# Patient Record
Sex: Male | Born: 1995 | Race: White | Hispanic: No | Marital: Single | Smoking: Former smoker
Health system: Southern US, Community
[De-identification: ages and names within clinical notes are randomized; demographics above are authoritative.]

## PROBLEM LIST (undated history)

## (undated) DIAGNOSIS — I341 Nonrheumatic mitral (valve) prolapse: Secondary | ICD-10-CM

## (undated) DIAGNOSIS — J45990 Exercise induced bronchospasm: Secondary | ICD-10-CM

## (undated) DIAGNOSIS — K649 Unspecified hemorrhoids: Secondary | ICD-10-CM

## (undated) DIAGNOSIS — I10 Essential (primary) hypertension: Secondary | ICD-10-CM

## (undated) DIAGNOSIS — K219 Gastro-esophageal reflux disease without esophagitis: Secondary | ICD-10-CM

## (undated) DIAGNOSIS — E559 Vitamin D deficiency, unspecified: Secondary | ICD-10-CM

## (undated) DIAGNOSIS — G4730 Sleep apnea, unspecified: Secondary | ICD-10-CM

## (undated) DIAGNOSIS — Z8489 Family history of other specified conditions: Secondary | ICD-10-CM

## (undated) DIAGNOSIS — F419 Anxiety disorder, unspecified: Secondary | ICD-10-CM

## (undated) DIAGNOSIS — Z8719 Personal history of other diseases of the digestive system: Secondary | ICD-10-CM

## (undated) DIAGNOSIS — R7303 Prediabetes: Secondary | ICD-10-CM

## (undated) DIAGNOSIS — R011 Cardiac murmur, unspecified: Secondary | ICD-10-CM

## (undated) DIAGNOSIS — K5790 Diverticulosis of intestine, part unspecified, without perforation or abscess without bleeding: Secondary | ICD-10-CM

## (undated) HISTORY — PX: TONSILLECTOMY AND ADENOIDECTOMY: SUR1326

## (undated) HISTORY — DX: Essential (primary) hypertension: I10

## (undated) HISTORY — PX: LUMBAR MICRODISCECTOMY: SHX99

## (undated) HISTORY — DX: Vitamin D deficiency, unspecified: E55.9

## (undated) HISTORY — DX: Anxiety disorder, unspecified: F41.9

## (undated) HISTORY — DX: Exercise induced bronchospasm: J45.990

## (undated) HISTORY — DX: Sleep apnea, unspecified: G47.30

## (undated) HISTORY — PX: ADENOIDECTOMY: SUR15

## (undated) HISTORY — PX: TYMPANOSTOMY TUBE PLACEMENT: SHX32

## (undated) HISTORY — PX: APPENDECTOMY: SHX54

## (undated) HISTORY — PX: ESOPHAGOGASTRODUODENOSCOPY: SHX1529

## (undated) HISTORY — PX: COLONOSCOPY W/ BIOPSIES: SHX1374

## (undated) HISTORY — PX: MEATOTOMY: SUR859

---
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
## 2015-03-01 ENCOUNTER — Ambulatory Visit (HOSPITAL_COMMUNITY)
Admission: RE | Admit: 2015-03-01 | Discharge: 2015-03-01 | Disposition: A | Discharge status: Home / Self Care | Payer: Managed Care, Other (non HMO) | Source: Ambulatory Visit | Attending: Pulmonary Disease | Admitting: Pulmonary Disease

## 2015-03-01 ENCOUNTER — Other Ambulatory Visit (HOSPITAL_COMMUNITY): Payer: Self-pay | Admitting: Pulmonary Disease

## 2015-03-01 ENCOUNTER — Other Ambulatory Visit (HOSPITAL_COMMUNITY)
Admission: RE | Admit: 2015-03-01 | Discharge: 2015-03-01 | Disposition: A | Discharge status: Home / Self Care | Payer: Managed Care, Other (non HMO) | Source: Ambulatory Visit | Attending: Pulmonary Disease | Admitting: Pulmonary Disease

## 2015-03-01 DIAGNOSIS — R634 Abnormal weight loss: Secondary | ICD-10-CM | POA: Diagnosis not present

## 2015-03-01 DIAGNOSIS — E039 Hypothyroidism, unspecified: Secondary | ICD-10-CM | POA: Insufficient documentation

## 2015-03-01 DIAGNOSIS — R599 Enlarged lymph nodes, unspecified: Secondary | ICD-10-CM

## 2015-03-01 DIAGNOSIS — R591 Generalized enlarged lymph nodes: Secondary | ICD-10-CM | POA: Insufficient documentation

## 2015-03-01 LAB — TSH: TSH: 1.453 u[IU]/mL (ref 0.350–4.500)

## 2015-03-01 MED ORDER — IOHEXOL 300 MG/ML  SOLN
75.0000 mL | Freq: Once | INTRAMUSCULAR | Status: AC | PRN
  Administered 2015-03-01: 75 mL via INTRAVENOUS

## 2015-05-10 ENCOUNTER — Ambulatory Visit (INDEPENDENT_AMBULATORY_CARE_PROVIDER_SITE_OTHER): Payer: Managed Care, Other (non HMO) | Admitting: Otolaryngology

## 2015-05-10 DIAGNOSIS — R590 Localized enlarged lymph nodes: Secondary | ICD-10-CM

## 2015-05-10 DIAGNOSIS — J310 Chronic rhinitis: Secondary | ICD-10-CM | POA: Diagnosis not present

## 2015-08-30 ENCOUNTER — Ambulatory Visit (INDEPENDENT_AMBULATORY_CARE_PROVIDER_SITE_OTHER): Payer: Managed Care, Other (non HMO) | Admitting: Otolaryngology

## 2015-08-30 DIAGNOSIS — R590 Localized enlarged lymph nodes: Secondary | ICD-10-CM | POA: Diagnosis not present

## 2016-01-10 ENCOUNTER — Ambulatory Visit (INDEPENDENT_AMBULATORY_CARE_PROVIDER_SITE_OTHER): Payer: Managed Care, Other (non HMO) | Admitting: Otolaryngology

## 2016-09-23 ENCOUNTER — Ambulatory Visit (INDEPENDENT_AMBULATORY_CARE_PROVIDER_SITE_OTHER): Payer: Self-pay

## 2016-09-23 ENCOUNTER — Ambulatory Visit (INDEPENDENT_AMBULATORY_CARE_PROVIDER_SITE_OTHER): Payer: Self-pay | Admitting: Orthopaedic Surgery

## 2016-09-23 ENCOUNTER — Encounter: Payer: Self-pay | Admitting: Orthopaedic Surgery

## 2016-09-23 VITALS — BP 134/85 | HR 65 | Temp 97.5°F | Ht 73.0 in | Wt 247.0 lb

## 2016-09-23 DIAGNOSIS — M25561 Pain in right knee: Secondary | ICD-10-CM

## 2016-09-23 NOTE — Patient Instructions (Signed)
Out of work 

## 2016-09-23 NOTE — Progress Notes (Signed)
Subjective:    Patient ID: Corey Henry, male    DOB: [birth year], 21 y.o.   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  Last night around 8 pm while he was on the job standing next to pump panel of the fire truck, he turned and felt a severe pop in the right knee and had severe pain.  He could not stand.  He has had pain all night and today.  He has had swelling.  He has no redness, no numbness.  He has used ice and Advil which helped.  He has no prior problems with his knee and no trauma.  He cannot fully extend the knee and limps badly.     Review of Systems  HENT: Negative for congestion.   Respiratory: Negative for cough and shortness of breath.   Cardiovascular: Negative for chest pain and leg swelling.  Endocrine: Negative for cold intolerance.  Musculoskeletal: Positive for arthralgias and gait problem.  Allergic/Immunologic: Negative for environmental allergies.   Past Medical History:  Diagnosis Date  . Exercise-induced asthma   . Hypertension     Past Surgical History:  Procedure Laterality Date  . APPENDECTOMY    . MEATOTOMY    . TONSILLECTOMY AND ADENOIDECTOMY    . TYMPANOSTOMY TUBE PLACEMENT      Current Outpatient Prescriptions on File Prior to Visit  Medication Sig Dispense Refill  . etodolac (LODINE) 300 MG capsule Take 1 capsule (300 mg total) by mouth every 8 (eight) hours. (Patient not taking: Reported on 09/23/2016) 60 capsule 3  . traMADol-acetaminophen (ULTRACET) 37.5-325 MG per tablet Take 1-2 tablets by mouth every 4 (four) hours as needed for pain. (Patient not taking: Reported on 09/23/2016) 90 tablet 5   No current facility-administered medications on file prior to visit.     Social History   Social History  . Marital status: Single    Spouse name: N/A  . Number of children: N/A  . Years of education: N/A   Occupational History  . Not on file.   Social History Main Topics  . Smoking status: Never Smoker   . Smokeless tobacco: Not on file  . Alcohol use No  . Drug use: No  . Sexual activity: Not on file   Other Topics Concern  . Not on file   Social History Narrative  . No narrative on file    Family History  Problem Relation Age of Onset  . Cancer Unknown   . Diabetes Unknown   . Diabetes Father     BP 134/85   Pulse 65   Temp (!) 97.5 F (36.4 C)   Ht 6\' 1"  (1.854 m)   Wt 247 lb (112 kg)   BMI 32.59 kg/m      Objective:   Physical Exam  Constitutional: He is oriented to person, place, and time. He appears well-developed and well-nourished.  HENT:  Head: Normocephalic and atraumatic.  Eyes: Conjunctivae and EOM are normal. Pupils are equal, round, and reactive to light.  Neck: Normal range of motion. Neck supple.  Cardiovascular: Normal rate, regular rhythm and intact distal pulses.   Pulmonary/Chest: Effort normal.  Abdominal: Soft.  Musculoskeletal: He exhibits tenderness (Right knee tender medially, 1+ effusion, ROM 5 to 90 with pain, unable to fully extend, positive medial McMurray, NV intact, limp right, left knee negative.).  Neurological: He is alert and oriented to person, place, and time. He has normal reflexes. He displays normal  reflexes. No cranial nerve deficit. He exhibits normal muscle tone. Coordination normal.  Skin: Skin is warm and dry.  Psychiatric: He has a normal mood and affect. His behavior is normal. Judgment and thought content normal.   X-rays were done and reported separately.       Assessment & Plan:   Encounter Diagnosis  Name Primary?  . Acute pain of right knee Yes   I am concerned about a medial meniscus tear of the right knee.  He cannot fully extend the knee, has pain, positive medial McMurray.  I would like to get a MRI.  Stay out of work.  Use crutch or crutches.  Elevate, ice, Advil.  Return after MRI.  Call if any problem.  Precautions discussed.  Electronically Signed Darreld Mclean, MD 7/10/20182:49  PM

## 2016-09-30 ENCOUNTER — Telehealth: Payer: Self-pay | Admitting: Orthopaedic Surgery

## 2016-09-30 NOTE — Telephone Encounter (Signed)
Patient called, as has been relayed to our clinical staff by Workers comp, that patient's claim for office visit of 09/23/08 has been denied by Workers comp.  States they will cover initial visit on this date only, and no services thereafter.  Patient aware to provide his (personal/private) insurance card, which is [employer].

## 2016-10-01 ENCOUNTER — Encounter: Payer: Self-pay | Admitting: Orthopaedic Surgery

## 2016-10-06 ENCOUNTER — Ambulatory Visit (HOSPITAL_COMMUNITY)
Admission: RE | Admit: 2016-10-06 | Discharge: 2016-10-06 | Disposition: A | Discharge status: Home / Self Care | Payer: Managed Care, Other (non HMO) | Source: Ambulatory Visit | Attending: Orthopaedic Surgery | Admitting: Orthopaedic Surgery

## 2016-10-06 DIAGNOSIS — R937 Abnormal findings on diagnostic imaging of other parts of musculoskeletal system: Secondary | ICD-10-CM | POA: Diagnosis not present

## 2016-10-06 DIAGNOSIS — M25561 Pain in right knee: Secondary | ICD-10-CM | POA: Insufficient documentation

## 2016-10-08 ENCOUNTER — Ambulatory Visit (INDEPENDENT_AMBULATORY_CARE_PROVIDER_SITE_OTHER): Payer: Managed Care, Other (non HMO) | Admitting: Orthopaedic Surgery

## 2016-10-08 ENCOUNTER — Encounter: Payer: Self-pay | Admitting: Orthopaedic Surgery

## 2016-10-08 VITALS — BP 106/79 | HR 58 | Temp 97.2°F | Ht 75.0 in | Wt 245.0 lb

## 2016-10-08 DIAGNOSIS — M25561 Pain in right knee: Secondary | ICD-10-CM

## 2016-10-08 NOTE — Patient Instructions (Signed)
Out of Work 

## 2016-10-08 NOTE — Progress Notes (Signed)
Patient ZO:XWRUE:Corey Henry, male DOB:1995/12/13, [age] y.o. AVW:[id number]  Chief Complaint  Patient presents with  . Follow-up    MRI review right knee    HPI  Corey Henry is a 21 y.o. male who has right knee pain.  He had MRI which showed: IMPRESSION: 1. Marrow edema in the periphery of the anterolateral femoral condyles most consistent with an osseous contusion likely secondary to remote transient lateral patellar dislocation.  I have explained findings to him.  I will give patella tracking brace.  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  I will keep him out.  HPI  Body mass index is 30.62 kg/m.  ROS  Review of Systems  Past Medical History:  Diagnosis Date  . Exercise-induced asthma   . Hypertension     Past Surgical History:  Procedure Laterality Date  . APPENDECTOMY    . MEATOTOMY    . TONSILLECTOMY AND ADENOIDECTOMY    . TYMPANOSTOMY TUBE PLACEMENT      Family History  Problem Relation Age of Onset  . Cancer Unknown   . Diabetes Unknown   . Diabetes Father     Social History Social History  Substance Use Topics  . Smoking status: Never Smoker  . Smokeless tobacco: Current User    Types: Snuff  . Alcohol use No    Allergies  Allergen Reactions  . Erythromycin   . Morphine     Current Outpatient Prescriptions  Medication Sig Dispense Refill  . citalopram (CELEXA) 10 MG tablet     . etodolac (LODINE) 300 MG capsule Take 1 capsule (300 mg total) by mouth every 8 (eight) hours. (Patient not taking: Reported on 09/23/2016) 60 capsule 3  . lisinopril (PRINIVIL,ZESTRIL) 5 MG tablet     . traMADol-acetaminophen (ULTRACET) 37.5-325 MG per tablet Take 1-2 tablets by mouth every 4 (four) hours as needed for pain. (Patient not taking: Reported on 09/23/2016) 90 tablet 5   No current facility-administered medications for this visit.      Physical Exam  Blood pressure 106/79, pulse (!) 58, temperature (!) 97.2 F (36.2 C), height 6\' 3"   (1.905 m), weight 245 lb (111.1 kg).  Constitutional: overall normal hygiene, normal nutrition, well developed, normal grooming, normal body habitus. Assistive device:none  Musculoskeletal: gait and station Limp right, muscle tone and strength are normal, no tremors or atrophy is present.  .  Neurological: coordination overall normal.  Deep tendon reflex/nerve stretch intact.  Sensation normal.  Cranial nerves II-XII intact.   Skin:   Normal overall no scars, lesions, ulcers or rashes. No psoriasis.  Psychiatric: Alert and oriented x 3.  Recent memory intact, remote memory unclear.  Normal mood and affect. Well groomed.  Good eye contact.  Cardiovascular: overall no swelling, no varicosities, no edema bilaterally, normal temperatures of the legs and arms, no clubbing, cyanosis and good capillary refill.  Lymphatic: palpation is normal.  Right knee has patella tenderness and full ROM.  He has no effusion.  Full extension is tender.  NV intact.  He has right limp.  The patient has been educated about the nature of the problem(s) and counseled on treatment options.  The patient appeared to understand what I have discussed and is in agreement with it.  Encounter Diagnosis  Name Primary?  . Acute pain of right knee Yes    PLAN Call if any problems.  Precautions discussed.  Continue current medications.   Return to clinic 2 weeks   Out  of work.  Brace given.  Electronically Signed Darreld McleanWayne Keeling, MD 7/25/20189:40 AM

## 2016-10-21 ENCOUNTER — Ambulatory Visit (INDEPENDENT_AMBULATORY_CARE_PROVIDER_SITE_OTHER): Payer: Managed Care, Other (non HMO) | Admitting: Orthopaedic Surgery

## 2016-10-21 ENCOUNTER — Encounter: Payer: Self-pay | Admitting: Orthopaedic Surgery

## 2016-10-21 VITALS — BP 137/96 | HR 80 | Temp 97.7°F | Ht 75.0 in | Wt 245.0 lb

## 2016-10-21 DIAGNOSIS — M25561 Pain in right knee: Secondary | ICD-10-CM

## 2016-10-21 NOTE — Progress Notes (Signed)
Patient ZO:XWRUE OBED Henry, male DOB:10-17-1995, 21 y.o. AVW:[id number]  Chief Complaint  Patient presents with  . Follow-up    Right knee pain    HPI  Corey Henry is a 21 y.o. male who is doing very well with the right knee now.  He has no pain, no increased swelling.  He is walking without difficulty.  I will send him back to work as of tomorrow, no restrictions.  He may want to use the brace for another week or two. HPI  Body mass index is 30.62 kg/m.  ROS  Review of Systems  HENT: Negative for congestion.   Respiratory: Negative for cough and shortness of breath.   Cardiovascular: Negative for chest pain and leg swelling.  Endocrine: Negative for cold intolerance.  Musculoskeletal: Positive for arthralgias and gait problem.  Allergic/Immunologic: Negative for environmental allergies.    Past Medical History:  Diagnosis Date  . Exercise-induced asthma   . Hypertension     Past Surgical History:  Procedure Laterality Date  . APPENDECTOMY    . MEATOTOMY    . TONSILLECTOMY AND ADENOIDECTOMY    . TYMPANOSTOMY TUBE PLACEMENT      Family History  Problem Relation Age of Onset  . Cancer Unknown   . Diabetes Unknown   . Diabetes Father     Social History Social History  Substance Use Topics  . Smoking status: Never Smoker  . Smokeless tobacco: Current User    Types: Snuff  . Alcohol use No    Allergies  Allergen Reactions  . Erythromycin   . Morphine     Current Outpatient Prescriptions  Medication Sig Dispense Refill  . citalopram (CELEXA) 10 MG tablet     . etodolac (LODINE) 300 MG capsule Take 1 capsule (300 mg total) by mouth every 8 (eight) hours. (Patient not taking: Reported on 09/23/2016) 60 capsule 3  . lisinopril (PRINIVIL,ZESTRIL) 5 MG tablet     . traMADol-acetaminophen (ULTRACET) 37.5-325 MG per tablet Take 1-2 tablets by mouth every 4 (four) hours as needed for pain. (Patient not taking: Reported on 09/23/2016) 90 tablet 5   No current  facility-administered medications for this visit.      Physical Exam  Blood pressure (!) 137/96, pulse 80, temperature 97.7 F (36.5 C), height 6\' 3"  (1.905 m), weight 245 lb (111.1 kg).  Constitutional: overall normal hygiene, normal nutrition, well developed, normal grooming, normal body habitus. Assistive device:none  Musculoskeletal: gait and station Limp none, muscle tone and strength are normal, no tremors or atrophy is present.  .  Neurological: coordination overall normal.  Deep tendon reflex/nerve stretch intact.  Sensation normal.  Cranial nerves II-XII intact.   Skin:   Normal overall no scars, lesions, ulcers or rashes. No psoriasis.  Psychiatric: Alert and oriented x 3.  Recent memory intact, remote memory unclear.  Normal mood and affect. Well groomed.  Good eye contact.  Cardiovascular: overall no swelling, no varicosities, no edema bilaterally, normal temperatures of the legs and arms, no clubbing, cyanosis and good capillary refill.  Lymphatic: palpation is normal.  Right knee has no effusion, full ROM, normal exam.  NV intact.  The patient has been educated about the nature of the problem(s) and counseled on treatment options.  The patient appeared to understand what I have discussed and is in agreement with it.  Encounter Diagnosis  Name Primary?  . Acute pain of right knee Yes    PLAN Call if any problems.  Precautions discussed.  Continue current  medications.   Return to clinic prn   Return to work full duty as of tomorrow, October 22, 2016.  Electronically Signed Darreld McleanWayne Keeling, MD 8/7/20189:21 AM

## 2016-10-21 NOTE — Patient Instructions (Signed)
Return to work fully duty as of tomorrow, October 22, 2016.

## 2016-10-22 ENCOUNTER — Ambulatory Visit: Payer: Worker's Compensation | Admitting: Orthopaedic Surgery

## 2019-01-07 ENCOUNTER — Ambulatory Visit (HOSPITAL_COMMUNITY)
Admission: RE | Admit: 2019-01-07 | Discharge: 2019-01-07 | Disposition: A | Discharge status: Home / Self Care | Payer: Federal, State, Local not specified - PPO | Source: Ambulatory Visit | Attending: Orthopedic Surgery | Admitting: Orthopedic Surgery

## 2019-01-07 ENCOUNTER — Other Ambulatory Visit: Payer: Self-pay | Admitting: Orthopedic Surgery

## 2019-01-07 ENCOUNTER — Other Ambulatory Visit: Payer: Self-pay

## 2019-01-07 DIAGNOSIS — M5410 Radiculopathy, site unspecified: Secondary | ICD-10-CM

## 2019-01-07 NOTE — Progress Notes (Signed)
1 week back pain with radicular pain //  Tried steroids flexeril   No trauma   In severe pain   Asked to order mri

## 2019-01-10 ENCOUNTER — Telehealth: Payer: Self-pay | Admitting: Orthopedic Surgery

## 2019-01-10 NOTE — Telephone Encounter (Signed)
L4-5: Disc desiccation. Broad-based central disc protrusion mildly indents the ventral thecal sac. Mild facet hypertrophy. Resultant moderate bilateral subarticular stenosis with mild narrowing of the central canal. Foramina remain patent.  L5-S1: Chronic intervertebral disc space narrowing with disc desiccation. Superimposed moderate sized right subarticular disc protrusion extends into the right lateral recess, impinging upon the descending right S1 nerve root. Severe right with moderate left subarticular stenosis. Superimposed mild facet hypertrophy. Foramina remain patent.  IMPRESSION: 1. Moderate sized right subarticular disc protrusion at L5-S1, impinging upon the descending right S1 nerve root. 2. Small central disc protrusion at L4-5 with resultant moderate bilateral subarticular stenosis.   Electronically Signed   By: Jeannine Boga M.D.   On: 01/08/2019 03:37   Called him ? Wrong number   Called mother left message

## 2019-01-11 ENCOUNTER — Telehealth: Payer: Self-pay | Admitting: Orthopedic Surgery

## 2019-01-11 DIAGNOSIS — M5410 Radiculopathy, site unspecified: Secondary | ICD-10-CM

## 2019-01-11 NOTE — Telephone Encounter (Signed)
Ok, thanks cancelled the Neurosurgery referral

## 2019-01-11 NOTE — Telephone Encounter (Signed)
Yes get them the epidurals

## 2019-01-11 NOTE — Telephone Encounter (Signed)
I called, no answer. I need his insurance information. His scan was not approved by insurance, not sure how it was scheduled  I need to find out what is going on. He has already been evaluated by [facility] ortho also.  See his message do you want them to proceed with injections?

## 2019-01-11 NOTE — Telephone Encounter (Signed)
I will send referral  Up to him. I will call him

## 2019-01-11 NOTE — Telephone Encounter (Signed)
They can handle

## 2019-01-11 NOTE — Telephone Encounter (Signed)
Corey Henry called and stated that he did speak to Dr. Aline Brochure regarding his MRI results.  He said that Dr. Aline Brochure told him that we would be referring him to a neurosurgeon.  He said that he was already supposed to have spinal injections that were ordered by [facility] and wants to know if he should go ahead and have those while waiting for referral to neurosurgeon or put them off?  Please call him  Thanks

## 2019-01-11 NOTE — Telephone Encounter (Signed)
Still proceed with neurosurgery or do you want their spine team to handle?   They have physiatrist and orthopedic spine surgeon

## 2019-01-11 NOTE — Telephone Encounter (Signed)
Patient returned call from his mobile phone# (updated as primary ph# in chart) [phone]

## 2019-01-11 NOTE — Telephone Encounter (Signed)
Patient now gave a work phone # as well, [phone], ext [phone]; states will be at work till 7:00pm this evening.

## 2019-01-11 NOTE — Telephone Encounter (Signed)
Tried to call him again, no answer.

## 2019-01-12 NOTE — Telephone Encounter (Signed)
I spoke to him, to advise him to continue with [facility], they have same set up as the neurosurgeons, and they can view his MRI  He did tell me his insurance is [employer], which does not require prior authorization. I have told him the hospital does not have a copy of his insurance card, he may need to provide it to them, in order for the MRI to be covered. He voiced understanding.

## 2019-01-12 NOTE — Telephone Encounter (Signed)
I called him back no answer on cell, called his work number he is not at work yet.

## 2019-08-04 ENCOUNTER — Ambulatory Visit
Admission: EM | Admit: 2019-08-04 | Discharge: 2019-08-04 | Discharge status: Absent without leave | Payer: Federal, State, Local not specified - PPO

## 2019-10-17 ENCOUNTER — Encounter: Payer: Self-pay | Admitting: Gastroenterology

## 2019-12-06 ENCOUNTER — Other Ambulatory Visit: Payer: Self-pay

## 2019-12-06 ENCOUNTER — Ambulatory Visit: Payer: Federal, State, Local not specified - PPO | Admitting: Pulmonary Disease

## 2019-12-06 ENCOUNTER — Encounter: Payer: Self-pay | Admitting: Pulmonary Disease

## 2019-12-06 VITALS — BP 118/76 | HR 98 | Temp 97.3°F | Ht 74.0 in | Wt 235.8 lb

## 2019-12-06 DIAGNOSIS — G4733 Obstructive sleep apnea (adult) (pediatric): Secondary | ICD-10-CM

## 2019-12-06 NOTE — Progress Notes (Signed)
Corey Henry    [id number]    Mar 05, 1996  Primary Care Physician:Weeks, Theressa Millard, PA  Referring Physician: Jolyne Loa, NP [address] [place],  [place] [address]  Chief complaint:   Patient with a history of moderate obstructive sleep apnea diagnosed in 2019  HPI:  Had a in lab study performed showing an AHI of 22, was titrated to CPAP of 9 Use the machine for about a year Had issues with the mask Almost woke up consistently with what he felt was pulmonary congestion He still has symptoms suggesting he has sleep significant sleep disordered breathing Is weight is about the same He has a history of anxiety disorder history of allergic rhinitis Mitral valve regurg Testosterone deficiency Essential hypertension  Usually goes to bed about 10:52 PM Takes him about 50 minutes to an hour to fall asleep usually does not wake up in the middle of the night Final wake up time between 4 and 9 AM Weight is about the same  Admits to dryness of his mouth in the mornings Denies morning headaches but does have headaches sometimes during the day Dad has OSA-was not diagnosed He does not wake up rested in the mornings  Quit smoking in 2019  Outpatient Encounter Medications as of 12/06/2019  Medication Sig  . clonazePAM (KLONOPIN) 0.5 MG tablet Take by mouth.  . esomeprazole (NEXIUM) 40 MG capsule Take 40 mg by mouth daily.  . [DISCONTINUED] citalopram (CELEXA) 10 MG tablet   . [DISCONTINUED] etodolac (LODINE) 300 MG capsule Take 1 capsule (300 mg total) by mouth every 8 (eight) hours. (Patient not taking: Reported on 09/23/2016)  . [DISCONTINUED] lisinopril (PRINIVIL,ZESTRIL) 5 MG tablet   . [DISCONTINUED] traMADol-acetaminophen (ULTRACET) 37.5-325 MG per tablet Take 1-2 tablets by mouth every 4 (four) hours as needed for pain. (Patient not taking: Reported on 09/23/2016)   No facility-administered encounter medications on file as of 12/06/2019.    Allergies as  of 12/06/2019 - Review Complete 12/06/2019  Allergen Reaction Noted  . Erythromycin    . Morphine      Past Medical History:  Diagnosis Date  . Exercise-induced asthma   . Hypertension     Past Surgical History:  Procedure Laterality Date  . APPENDECTOMY    . MEATOTOMY    . TONSILLECTOMY AND ADENOIDECTOMY    . TYMPANOSTOMY TUBE PLACEMENT      Family History  Problem Relation Age of Onset  . Cancer Unknown   . Diabetes Unknown   . Diabetes Father     Social History   Socioeconomic History  . Marital status: Single    Spouse name: Not on file  . Number of children: Not on file  . Years of education: Not on file  . Highest education level: Not on file  Occupational History  . Not on file  Tobacco Use  . Smoking status: Never Smoker  . Smokeless tobacco: Current User    Types: Snuff  Substance and Sexual Activity  . Alcohol use: No  . Drug use: No  . Sexual activity: Not on file  Other Topics Concern  . Not on file  Social History Narrative  . Not on file   Social Determinants of Health   Financial Resource Strain:   . Difficulty of Paying Living Expenses: Not on file  Food Insecurity:   . Worried About Programme researcher, broadcasting/film/video in the Last Year: Not on file  . Ran Out of Food  in the Last Year: Not on file  Transportation Needs:   . Lack of Transportation (Medical): Not on file  . Lack of Transportation (Non-Medical): Not on file  Physical Activity:   . Days of Exercise per Week: Not on file  . Minutes of Exercise per Session: Not on file  Stress:   . Feeling of Stress : Not on file  Social Connections:   . Frequency of Communication with Friends and Family: Not on file  . Frequency of Social Gatherings with Friends and Family: Not on file  . Attends Religious Services: Not on file  . Active Member of Clubs or Organizations: Not on file  . Attends Banker Meetings: Not on file  . Marital Status: Not on file  Intimate Partner Violence:   .  Fear of Current or Ex-Partner: Not on file  . Emotionally Abused: Not on file  . Physically Abused: Not on file  . Sexually Abused: Not on file    Review of Systems  Constitutional: Positive for fatigue.  HENT: Negative.   Respiratory: Positive for apnea.   Psychiatric/Behavioral: Positive for sleep disturbance.    Vitals:   12/06/19 1530  BP: 118/76  Pulse: 98  Temp: (!) 97.3 F (36.3 C)  SpO2: (!) 62%     Physical Exam Constitutional:      Appearance: He is obese.  HENT:     Head: Normocephalic and atraumatic.     Nose: No congestion or rhinorrhea.     Mouth/Throat:     Comments: Crowded oropharynx, Mallampati 4, micrognathia Eyes:     General:        Right eye: No discharge.        Left eye: No discharge.  Cardiovascular:     Rate and Rhythm: Normal rate and regular rhythm.     Heart sounds: No murmur heard.  No friction rub.  Pulmonary:     Effort: No respiratory distress.     Breath sounds: No stridor. No wheezing or rhonchi.  Musculoskeletal:     Cervical back: No rigidity or tenderness.  Neurological:     General: No focal deficit present.     Mental Status: He is alert.  Psychiatric:        Mood and Affect: Mood normal.    Results of the Epworth flowsheet 12/06/2019  Sitting and reading 3  Watching TV 3  Sitting, inactive in a public place (e.g. a theatre or a meeting) 0  As a passenger in a car for an hour without a break 3  Lying down to rest in the afternoon when circumstances permit 2  Sitting and talking to someone 0  Sitting quietly after a lunch without alcohol 3  In a car, while stopped for a few minutes in traffic 0  Total score 14    Data Reviewed: Sleep study from [facility] reviewed showing moderate obstructive sleep apnea with an AHI of 22 Titrated to a CPAP of 9 with a final AHI of 5  Assessment:  Moderate obstructive sleep apnea  Persistence of symptoms   Obesity  Excessive daytime  sleepiness   Plan/Recommendations: Schedule patient for home sleep study  Treatment options discussed with the patient Patient will benefit from an auto titrating CPAP  We will see him about 4 to 6 weeks following initiation of treatment  Prefers to go through [facility] for his supplies  Encouraged to call with any significant concerns   Virl Diamond MD [facility]  12/06/2019, 3:57 PM  CC: Jolyne Loa, NP

## 2019-12-06 NOTE — Patient Instructions (Signed)
History of obstructive sleep apnea  We will schedule you for home sleep study Update your results as soon as reviewed  Contact [facility] with your test results to set you up with a CPAP -An auto titrating CPAP will work well   Follow-up in 3 months tentatively  Call with significant concerns

## 2019-12-23 ENCOUNTER — Encounter: Payer: Self-pay | Admitting: Gastroenterology

## 2019-12-23 ENCOUNTER — Other Ambulatory Visit (INDEPENDENT_AMBULATORY_CARE_PROVIDER_SITE_OTHER): Payer: Federal, State, Local not specified - PPO

## 2019-12-23 ENCOUNTER — Other Ambulatory Visit: Payer: Self-pay

## 2019-12-23 ENCOUNTER — Ambulatory Visit: Payer: Federal, State, Local not specified - PPO | Admitting: Gastroenterology

## 2019-12-23 ENCOUNTER — Ambulatory Visit (INDEPENDENT_AMBULATORY_CARE_PROVIDER_SITE_OTHER)
Admission: RE | Admit: 2019-12-23 | Discharge: 2019-12-23 | Disposition: A | Discharge status: Home / Self Care | Payer: Federal, State, Local not specified - PPO | Source: Ambulatory Visit | Attending: Gastroenterology | Admitting: Gastroenterology

## 2019-12-23 VITALS — BP 112/84 | HR 72 | Ht 74.0 in | Wt 230.0 lb

## 2019-12-23 DIAGNOSIS — Z8619 Personal history of other infectious and parasitic diseases: Secondary | ICD-10-CM | POA: Diagnosis not present

## 2019-12-23 DIAGNOSIS — R1030 Lower abdominal pain, unspecified: Secondary | ICD-10-CM

## 2019-12-23 DIAGNOSIS — K219 Gastro-esophageal reflux disease without esophagitis: Secondary | ICD-10-CM

## 2019-12-23 DIAGNOSIS — K582 Mixed irritable bowel syndrome: Secondary | ICD-10-CM

## 2019-12-23 LAB — HEPATIC FUNCTION PANEL
ALT: 25 U/L (ref 0–53)
AST: 21 U/L (ref 0–37)
Albumin: 4.7 g/dL (ref 3.5–5.2)
Alkaline Phosphatase: 59 U/L (ref 39–117)
Bilirubin, Direct: 0.2 mg/dL (ref 0.0–0.3)
Total Bilirubin: 1 mg/dL (ref 0.2–1.2)
Total Protein: 8.2 g/dL (ref 6.0–8.3)

## 2019-12-23 LAB — SEDIMENTATION RATE: Sed Rate: 10 mm/hr (ref 0–15)

## 2019-12-23 LAB — PROTIME-INR
INR: 1.2 ratio — ABNORMAL HIGH (ref 0.8–1.0)
Prothrombin Time: 13.6 s — ABNORMAL HIGH (ref 9.6–13.1)

## 2019-12-23 LAB — C-REACTIVE PROTEIN: CRP: <1 mg/dL (ref 0.5–20.0)

## 2019-12-23 MED ORDER — DICYCLOMINE HCL 20 MG PO TABS: 20.0000 mg | ORAL_TABLET | Freq: Three times a day (TID) | ORAL | 1 refills | Status: DC

## 2019-12-23 MED ORDER — SUPREP BOWEL PREP KIT 17.5-3.13-1.6 GM/177ML PO SOLN: 1.0000 | ORAL | 0 refills | Status: DC

## 2019-12-23 NOTE — Patient Instructions (Signed)
You have been scheduled for an endoscopy and colonoscopy. Please follow the written instructions given to you at your visit today. Please pick up your prep supplies at the pharmacy within the next 1-3 days. If you use inhalers (even only as needed), please bring them with you on the day of your procedure.   We have sent the following medications to your pharmacy for you to pick up at your convenience: Suprep , Bentyl   START: Bentyl ( Dicyclomine) Take 1 capsule 1-3 times daily as needed for abdomen cramping.   You  have been scheduled for an abdominal ultrasound at [facility]  on 12/29/19 at 3:30pm. Please arrive 15 minutes prior to your appointment for registration. Make certain not to have anything to eat or drink 6 hours prior to your appointment. Should you need to reschedule your appointment, please contact radiology at [phone]. This test typically takes about 30 minutes to perform.  Your provider has requested that you have an abdominal x ray before leaving today. Please go to the basement floor to our Radiology department for the test.  Your provider has requested that you go to the basement level for lab work before leaving today. Press "B" on the elevator. The lab is located at the first door on the left as you exit the elevator.  Due to recent changes in healthcare laws, you may see the results of your imaging and laboratory studies on MyChart before your provider has had a chance to review them.  We understand that in some cases there may be results that are confusing or concerning to you. Not all laboratory results come back in the same time frame and the provider may be waiting for multiple results in order to interpret others.  Please give [place] 48 hours in order for your provider to thoroughly review all the results before contacting the office for clarification of your results.   Thank you for choosing me and [facility].  Dr. Meridee Score

## 2019-12-26 ENCOUNTER — Encounter: Payer: Self-pay | Admitting: Gastroenterology

## 2019-12-26 DIAGNOSIS — K219 Gastro-esophageal reflux disease without esophagitis: Secondary | ICD-10-CM | POA: Insufficient documentation

## 2019-12-26 DIAGNOSIS — R1030 Lower abdominal pain, unspecified: Secondary | ICD-10-CM | POA: Insufficient documentation

## 2019-12-26 DIAGNOSIS — K582 Mixed irritable bowel syndrome: Secondary | ICD-10-CM | POA: Insufficient documentation

## 2019-12-26 DIAGNOSIS — Z8619 Personal history of other infectious and parasitic diseases: Secondary | ICD-10-CM | POA: Insufficient documentation

## 2019-12-26 LAB — TISSUE TRANSGLUTAMINASE, IGA: (tTG) Ab, IgA: <1 U/mL

## 2019-12-26 NOTE — Progress Notes (Signed)
[facility] [facility] VISIT   Primary Care Provider Nathanial Millman, [place] [address] [phone]  Referring Provider Rica Records, NP [address] [place] [address] [phone]  Patient Profile: Corey Henry is a 24 y.o. male with a pmh significant for anxiety, exercise-induced asthma, hypertension (not on medications), vitamin D deficiency, prior history of C. Difficile.  The patient presents to the [facility] for an evaluation and management of problem(s) noted below:  Problem List 1. Gastroesophageal reflux disease, unspecified whether esophagitis present   2. Irritable bowel syndrome with both constipation and diarrhea   3. History of Clostridioides difficile colitis     History of Present Illness This is the patient's first visit to the outpatient [facility] clinic.  The patient previously was evaluated at [facility] gastroenterology by Dr. Paulita Fujita a few years ago.  He was found at that time to have C. difficile infection as well as E. coli infection.  He was treated with antibiotics but requested follow-up and was never able to have that done.  The patient states over the course of the last 2 years he has had continued issues of abdominal discomfort but the last 3 to 6 months have been even worse.  He has acute pain and discomfort in association with cramping that occurs many times throughout the day.  He may have diarrheal bowel movements at times as well as mucus.  He has not noticed any blood.  He has not had any recent travels.  He has not been retested for C. difficile either.  The patient also has longer standing issues of acid reflux and pyrosis for which she takes Nexium and has relatively good effect without significant breakthrough.  He has been tested for alpha gal by his allergist and was told that he did not have that.  There is a family history of colon cancer in a  secondary member of his family.  Patient does not use significant nonsteroidals or BC/Goody powders.  Patient does have issues with stress and anxiety but has tried many different SSRIs/SNRIs/MAOIs without great effect.  He takes Klonopin and has good control of his anxiety per his report.  Patient is a paramedic.  He has never had an upper or lower endoscopy.  GI Review of Systems Positive as above Negative for dysphagia, odynophagia, nausea, vomiting, melena  Review of Systems General: Denies fevers/chills/weight loss/night sweats HEENT: Denies oral lesions/sore throat/headaches/visual changes Cardiovascular: Denies chest pain/palpitations Pulmonary: Denies shortness of breath/cough Gastroenterological: See HPI Genitourinary: Denies darkened urine or hematuria Hematological: Denies easy bruising/bleeding Endocrine: Denies temperature intolerance Dermatological: Denies skin changes Psychological: Mood is stable Allergy & Immunology: Denies severe allergic reactions Musculoskeletal: Denies new arthralgias   Medications Current Outpatient Medications  Medication Sig Dispense Refill  . clonazePAM (KLONOPIN) 0.5 MG tablet Take 0.5 mg by mouth. Takes 0.5 mg twice a day    . esomeprazole (NEXIUM) 40 MG capsule Take 40 mg by mouth daily.    Marland Kitchen dicyclomine (BENTYL) 20 MG tablet Take 1 tablet (20 mg total) by mouth 4 (four) times daily -  before meals and at bedtime. 30 tablet 1  . Na Sulfate-K Sulfate-Mg Sulf (SUPREP BOWEL PREP KIT) 17.5-3.13-1.6 GM/177ML SOLN Take 1 kit by mouth as directed. For colonoscopy prep 354 mL 0   No current facility-administered medications for this visit.    Allergies Allergies  Allergen Reactions  . Azithromycin Other (See Comments)  Patient stated that he has sore throat. Mouth sores  . Erythromycin   . Morphine     Histories Past Medical History:  Diagnosis Date  . Anxiety   . Exercise-induced asthma   . Hypertension   . Sleep apnea   .  Vitamin D deficiency    Past Surgical History:  Procedure Laterality Date  . APPENDECTOMY    . LUMBAR MICRODISCECTOMY    . MEATOTOMY    . TONSILLECTOMY AND ADENOIDECTOMY    . TYMPANOSTOMY TUBE PLACEMENT     Social History   Socioeconomic History  . Marital status: Single    Spouse name: Not on file  . Number of children: Not on file  . Years of education: Not on file  . Highest education level: Not on file  Occupational History  . Not on file  Tobacco Use  . Smoking status: Former Research scientist (life sciences)  . Smokeless tobacco: Former Systems developer    Types: Snuff  Vaping Use  . Vaping Use: Every day  . Substances: Nicotine, Flavoring  Substance and Sexual Activity  . Alcohol use: No  . Drug use: No  . Sexual activity: Not on file  Other Topics Concern  . Not on file  Social History Narrative  . Not on file   Social Determinants of Health   Financial Resource Strain:   . Difficulty of Paying Living Expenses: Not on file  Food Insecurity:   . Worried About Charity fundraiser in the Last Year: Not on file  . Ran Out of Food in the Last Year: Not on file  Transportation Needs:   . Lack of Transportation (Medical): Not on file  . Lack of Transportation (Non-Medical): Not on file  Physical Activity:   . Days of Exercise per Week: Not on file  . Minutes of Exercise per Session: Not on file  Stress:   . Feeling of Stress : Not on file  Social Connections:   . Frequency of Communication with Friends and Family: Not on file  . Frequency of Social Gatherings with Friends and Family: Not on file  . Attends Religious Services: Not on file  . Active Member of Clubs or Organizations: Not on file  . Attends Archivist Meetings: Not on file  . Marital Status: Not on file  Intimate Partner Violence:   . Fear of Current or Ex-Partner: Not on file  . Emotionally Abused: Not on file  . Physically Abused: Not on file  . Sexually Abused: Not on file   Family History  Problem Relation Age of  Onset  . Cancer Other   . Diabetes Other   . Diabetes Father   . Colon cancer Maternal Grandmother   . Lung cancer Maternal Grandfather   . Mesothelioma Paternal Grandfather   . Diverticulitis Paternal Uncle   . Stomach cancer Neg Hx   . Pancreatic cancer Neg Hx   . Esophageal cancer Neg Hx   . Inflammatory bowel disease Neg Hx   . Liver disease Neg Hx    I have reviewed his medical, social, and family history in detail and updated the electronic medical record as necessary.    PHYSICAL EXAMINATION  BP 112/84   Pulse 72   Ht 6' 2" (1.88 m)   Wt 230 lb (104.3 kg)   BMI 29.53 kg/m  Wt Readings from Last 3 Encounters:  12/23/19 230 lb (104.3 kg)  12/06/19 235 lb 12.8 oz (107 kg)  10/21/16 245 lb (111.1 kg)  GEN:  NAD, appears stated age, doesn't appear chronically ill PSYCH: Cooperative, without pressured speech EYE: Conjunctivae pink, sclerae anicteric ENT: MMM, without oral ulcers, no erythema or exudates noted CV: RR without R/Gs  RESP: CTAB posteriorly, without wheezing GI: NABS, soft, mild tenderness to palpation in the lower abdomen, nondistended, without rebound or guarding, no HSM appreciated MSK/EXT: No lower extremity edema SKIN: No jaundice NEURO:  Alert & Oriented x 3, no focal deficits   REVIEW OF DATA  I reviewed the following data at the time of this encounter:  GI Procedures and Studies  No relevant studies to review  Laboratory Studies  Reviewed those in epic  Imaging Studies  No relevant studies to review   ASSESSMENT  Mr. Bonelli is a 24 y.o. male with a pmh significant for anxiety, exercise-induced asthma, hypertension (not on medications), vitamin D deficiency, prior history of C. Difficile.  The patient is seen today for evaluation and management of:  1. Gastroesophageal reflux disease, unspecified whether esophagitis present   2. Irritable bowel syndrome with both constipation and diarrhea   3. History of Clostridioides difficile colitis     The patient is hemodynamically stable.  Clinically however he has continued to have multiple symptoms over the course the last few years but more significantly in the course of the last few months.  Patient may have underlying irritable bowel syndrome however based on the persistence of symptoms he does require additional work-up and evaluation.  His work-up as outlined below.  Is not completely clear that the patient has overt constipation overflow but we will rule that out with a KUB.  It is important for [place] to further define the patient's abdominal discomfort and we will proceed with ultrasound imaging at a minimum and consider cross-sectional imaging depending on further work-up and management.  We will rule out exocrine pancreas insufficiency and consider SIBO in the future.  The patient will benefit from upper and lower endoscopic evaluation with biopsies to be obtained.  We will rule out inflammatory bowel disorders.  The risks and benefits of endoscopic evaluation were discussed with the patient; these include but are not limited to the risk of perforation, infection, bleeding, missed lesions, lack of diagnosis, severe illness requiring hospitalization, as well as anesthesia and sedation related illnesses.  The patient is agreeable to proceed.  Once work-up has been completed we will consider neck steps in evaluation.  I would like the patient to be initiated on Bentyl 2-4 times per day as needed but use mostly at the time of significant abdominal cramping that may occur during nighttime.  All patient questions were answered to the best of my ability, and the patient agrees to the aforementioned plan of action with follow-up as indicated.   PLAN  Laboratories as outlined below Obtain KUB Abdominal ultrasound to be obtained -If abdominal ultrasound is unremarkable and issues persist will consider cross-sectional CT abdomen/pelvis Seems less likely to be C. difficile based on his current symptoms  occurring mostly postprandially with a quick gastrocolic reflex so hold on stool studies for now Diagnostic upper and lower endoscopy to be performed (Esophageal/gastric/duodenal/colon/TI biopsies to be obtained) Patient will give [place] a list of the previous SSRIs/SNRIs/MAOIs/TCAs that he has used in the past We will consider IBgard Low likelihood for probiotic to be helpful but could consider   Orders Placed This Encounter  Procedures  . DG Abd 2 Views  . US Abdomen Complete  . Pancreatic elastase, fecal  . Hepatic function panel  . Sedimentation  rate  . C-reactive protein  . IgA  . Tissue transglutaminase, IgA  . INR/PT  . Ambulatory referral to Gastroenterology    New Prescriptions   DICYCLOMINE (BENTYL) 20 MG TABLET    Take 1 tablet (20 mg total) by mouth 4 (four) times daily -  before meals and at bedtime.   NA SULFATE-K SULFATE-MG SULF (SUPREP BOWEL PREP KIT) 17.5-3.13-1.6 GM/177ML SOLN    Take 1 kit by mouth as directed. For colonoscopy prep   Modified Medications   No medications on file    Planned Follow Up No follow-ups on file.   Total Time in Face-to-Face and in Coordination of Care for patient including independent/personal interpretation/review of prior testing, medical history, examination, medication adjustment, communicating results with the patient directly, and documentation with the EHR is 55 minutes.   Justice Britain, MD [facility]oscopy Office # [phone]

## 2019-12-29 ENCOUNTER — Ambulatory Visit (HOSPITAL_COMMUNITY)
Admission: RE | Admit: 2019-12-29 | Discharge: 2019-12-29 | Disposition: A | Discharge status: Home / Self Care | Payer: Federal, State, Local not specified - PPO | Source: Ambulatory Visit | Attending: Gastroenterology | Admitting: Gastroenterology

## 2019-12-29 ENCOUNTER — Ambulatory Visit: Payer: Federal, State, Local not specified - PPO

## 2019-12-29 ENCOUNTER — Telehealth: Payer: Self-pay | Admitting: Gastroenterology

## 2019-12-29 ENCOUNTER — Other Ambulatory Visit: Payer: Self-pay

## 2019-12-29 DIAGNOSIS — K219 Gastro-esophageal reflux disease without esophagitis: Secondary | ICD-10-CM | POA: Diagnosis not present

## 2019-12-29 DIAGNOSIS — Z8619 Personal history of other infectious and parasitic diseases: Secondary | ICD-10-CM | POA: Insufficient documentation

## 2019-12-29 DIAGNOSIS — K582 Mixed irritable bowel syndrome: Secondary | ICD-10-CM | POA: Diagnosis present

## 2019-12-29 NOTE — Telephone Encounter (Signed)
Returned pt call. His appt was cancelled in error by pulmonology office. Appt  Has been placed back on schedule for 01/06/20 with Dr. Meridee Score in the [facility].

## 2019-12-29 NOTE — Telephone Encounter (Signed)
Documentation of previously used TCAs/MAOIs/SSRIs/SNRIs in this patient  Pristiq Bupropion Desipramine Fluoxetine Citalopram Sertraline Mirtazepine Imipramine Trazodone Gabapentin Buspirone Atomoxetine Hydroxyzine

## 2020-01-06 ENCOUNTER — Ambulatory Visit (AMBULATORY_SURGERY_CENTER): Payer: Federal, State, Local not specified - PPO | Admitting: Gastroenterology

## 2020-01-06 ENCOUNTER — Encounter: Payer: Self-pay | Admitting: Gastroenterology

## 2020-01-06 ENCOUNTER — Encounter: Payer: Federal, State, Local not specified - PPO | Admitting: Gastroenterology

## 2020-01-06 ENCOUNTER — Other Ambulatory Visit: Payer: Self-pay

## 2020-01-06 VITALS — BP 113/80 | HR 66 | Temp 97.8°F | Resp 18 | Ht 74.0 in | Wt 230.0 lb

## 2020-01-06 DIAGNOSIS — K2970 Gastritis, unspecified, without bleeding: Secondary | ICD-10-CM | POA: Diagnosis not present

## 2020-01-06 DIAGNOSIS — K449 Diaphragmatic hernia without obstruction or gangrene: Secondary | ICD-10-CM

## 2020-01-06 DIAGNOSIS — K5730 Diverticulosis of large intestine without perforation or abscess without bleeding: Secondary | ICD-10-CM | POA: Diagnosis not present

## 2020-01-06 DIAGNOSIS — K529 Noninfective gastroenteritis and colitis, unspecified: Secondary | ICD-10-CM

## 2020-01-06 DIAGNOSIS — K3189 Other diseases of stomach and duodenum: Secondary | ICD-10-CM | POA: Diagnosis not present

## 2020-01-06 DIAGNOSIS — K200 Eosinophilic esophagitis: Secondary | ICD-10-CM

## 2020-01-06 DIAGNOSIS — K2950 Unspecified chronic gastritis without bleeding: Secondary | ICD-10-CM

## 2020-01-06 DIAGNOSIS — K219 Gastro-esophageal reflux disease without esophagitis: Secondary | ICD-10-CM

## 2020-01-06 DIAGNOSIS — K641 Second degree hemorrhoids: Secondary | ICD-10-CM

## 2020-01-06 DIAGNOSIS — R1030 Lower abdominal pain, unspecified: Secondary | ICD-10-CM

## 2020-01-06 MED ORDER — SODIUM CHLORIDE 0.9 % IV SOLN: 500.0000 mL | Freq: Once | INTRAVENOUS | Status: DC

## 2020-01-06 NOTE — Progress Notes (Signed)
Vitals-CW  Pt's states no medical or surgical changes since previsit or office visit. 

## 2020-01-06 NOTE — Progress Notes (Signed)
Report given to PACU, vss 

## 2020-01-06 NOTE — Progress Notes (Signed)
Called to room to assist during endoscopic procedure.  Patient ID and intended procedure confirmed with present staff. Received instructions for my participation in the procedure from the performing physician.  

## 2020-01-06 NOTE — Op Note (Signed)
[facility] Patient Name: Corey Henry Procedure Date: 01/06/2020 9:09 AM MRN: [id number] Endoscopist: Justice Britain , MD Age: 24 Referring MD:  Date of Birth: 1996-01-15 Gender: Male Account #: [id number] Procedure:                Colonoscopy Indications:              Chronic diarrhea, History of previous C. difficile                            infection years prior Medicines:                Monitored Anesthesia Care Procedure:                Pre-Anesthesia Assessment:                           - Prior to the procedure, a History and Physical                            was performed, and patient medications and                            allergies were reviewed. The patient's tolerance of                            previous anesthesia was also reviewed. The risks                            and benefits of the procedure and the sedation                            options and risks were discussed with the patient.                            All questions were answered, and informed consent                            was obtained. Prior Anticoagulants: The patient has                            taken no previous anticoagulant or antiplatelet                            agents. ASA Grade Assessment: II - A patient with                            mild systemic disease. After reviewing the risks                            and benefits, the patient was deemed in                            satisfactory condition to undergo the procedure.  After obtaining informed consent, the colonoscope                            was passed under direct vision. Throughout the                            procedure, the patient's blood pressure, pulse, and                            oxygen saturations were monitored continuously. The                            Colonoscope was introduced through the anus and                            advanced to the 8 cm into the ileum.  The                            colonoscopy was performed without difficulty. The                            patient tolerated the procedure. The quality of the                            bowel preparation was good. The terminal ileum,                            ileocecal valve, appendiceal orifice, and rectum                            were photographed. Scope In: 9:27:41 AM Scope Out: 9:37:43 AM Scope Withdrawal Time: 0 hours 9 minutes 29 seconds  Total Procedure Duration: 0 hours 10 minutes 2 seconds  Findings:                 The digital rectal exam findings include                            hemorrhoids. Pertinent negatives include no                            palpable rectal lesions.                           The terminal ileum and ileocecal valve appeared                            normal.                           Multiple small-mouthed diverticula were found in                            the sigmoid colon and descending colon.                             Normal mucosa was found in the entire colon                            otherwise. Biopsies for histology were taken with a                            cold forceps from the entire colon for evaluation                            of microscopic colitis.                           Non-bleeding non-thrombosed external and internal                            hemorrhoids were found during retroflexion, during                            perianal exam and during digital exam. The                            hemorrhoids were Grade II (internal hemorrhoids                            that prolapse but reduce spontaneously). Complications:            No immediate complications. Estimated Blood Loss:     Estimated blood loss was minimal. Impression:               - Hemorrhoids found on digital rectal exam.                           - The examined portion of the ileum was normal.                           - Diverticulosis in the sigmoid colon and  in the                            descending colon.                           - Normal mucosa in the entire examined colon.                            Biopsied.                           - Non-bleeding non-thrombosed external and internal                            hemorrhoids. Recommendation:           - The patient will be observed post-procedure,                            until all discharge criteria are met.                           -   Discharge patient to home.                           - Patient has a contact number available for                            emergencies. The signs and symptoms of potential                            delayed complications were discussed with the                            patient. Return to normal activities tomorrow.                            Written discharge instructions were provided to the                            patient.                           - High fiber diet.                           - Use FiberCon 1-2 tablets PO daily.                           - Continue present medications.                           - Await pathology results.                           - Repeat colonoscopy for screening purposes at age                            45.                           - The findings and recommendations were discussed                            with the patient. Gabriel Mansouraty, MD 01/06/2020 9:52:29 AM 

## 2020-01-06 NOTE — Patient Instructions (Signed)
Gastritis, Hiatal Hernia, Reflux, Diverticulosis and Hemorrhoids, High fiber diet (Handouts given)   YOU HAD AN ENDOSCOPIC PROCEDURE TODAY AT THE [facility]:   Refer to the procedure report that was given to you for any specific questions about what was found during the examination.  If the procedure report does not answer your questions, please call your gastroenterologist to clarify.  If you requested that your care partner not be given the details of your procedure findings, then the procedure report has been included in a sealed envelope for you to review at your convenience later.  YOU SHOULD EXPECT: Some feelings of bloating in the abdomen. Passage of more gas than usual.  Walking can help get rid of the air that was put into your GI tract during the procedure and reduce the bloating. If you had a lower endoscopy (such as a colonoscopy or flexible sigmoidoscopy) you may notice spotting of blood in your stool or on the toilet paper. If you underwent a bowel prep for your procedure, you may not have a normal bowel movement for a few days.  Please Note:  You might notice some irritation and congestion in your nose or some drainage.  This is from the oxygen used during your procedure.  There is no need for concern and it should clear up in a day or so.  SYMPTOMS TO REPORT IMMEDIATELY:   Following lower endoscopy (colonoscopy or flexible sigmoidoscopy):  Excessive amounts of blood in the stool  Significant tenderness or worsening of abdominal pains  Swelling of the abdomen that is new, acute  Fever of 100F or higher   Following upper endoscopy (EGD)  Vomiting of blood or coffee ground material  New chest pain or pain under the shoulder blades  Painful or persistently difficult swallowing  New shortness of breath  Fever of 100F or higher  Black, tarry-looking stools  For urgent or emergent issues, a gastroenterologist can be reached at any hour by calling [phone]. Do  not use MyChart messaging for urgent concerns.    DIET:  We do recommend a small meal at first, but then you may proceed to your regular diet.  Drink plenty of fluids but you should avoid alcoholic beverages for 24 hours.  ACTIVITY:  You should plan to take it easy for the rest of today and you should NOT DRIVE or use heavy machinery until tomorrow (because of the sedation medicines used during the test).    FOLLOW UP: Our staff will call the number listed on your records 48-72 hours following your procedure to check on you and address any questions or concerns that you may have regarding the information given to you following your procedure. If we do not reach you, we will leave a message.  We will attempt to reach you two times.  During this call, we will ask if you have developed any symptoms of COVID 19. If you develop any symptoms (ie: fever, flu-like symptoms, shortness of breath, cough etc.) before then, please call [phone].  If you test positive for Covid 19 in the 2 weeks post procedure, please call and report this information to [place].    If any biopsies were taken you will be contacted by phone or by letter within the next 1-3 weeks.  Please call us at [phone] if you have not heard about the biopsies in 3 weeks.    SIGNATURES/CONFIDENTIALITY: You and/or your care partner have signed paperwork which will be entered into your electronic medical  record.  These signatures attest to the fact that that the information above on your After Visit Summary has been reviewed and is understood.  Full responsibility of the confidentiality of this discharge information lies with you and/or your care-partner. 

## 2020-01-06 NOTE — Progress Notes (Signed)
0915 Robinul 0.1 mg IV given due large amount of secretions upon assessment.  MD made aware, vss  

## 2020-01-06 NOTE — Op Note (Signed)
[facility] Patient Name: Corey Henry Procedure Date: 01/06/2020 9:10 AM MRN: [id number] Endoscopist: Corliss Parish , MD Age: 24 Referring MD:  Date of Birth: 1996-01-17 Gender: Male Account #: [id number] Procedure:                Upper GI endoscopy Indications:              Generalized abdominal pain, Heartburn Medicines:                Monitored Anesthesia Care Procedure:                Pre-Anesthesia Assessment:                           - Prior to the procedure, a History and Physical                            was performed, and patient medications and                            allergies were reviewed. The patient's tolerance of                            previous anesthesia was also reviewed. The risks                            and benefits of the procedure and the sedation                            options and risks were discussed with the patient.                            All questions were answered, and informed consent                            was obtained. Prior Anticoagulants: The patient has                            taken no previous anticoagulant or antiplatelet                            agents. ASA Grade Assessment: II - A patient with                            mild systemic disease. After reviewing the risks                            and benefits, the patient was deemed in                            satisfactory condition to undergo the procedure.                           After obtaining informed consent, the endoscope was  passed under direct vision. Throughout the                            procedure, the patient's blood pressure, pulse, and                            oxygen saturations were monitored continuously. The                            Endoscope was introduced through the mouth, and                            advanced to the second part of duodenum. The upper                            GI endoscopy was  accomplished without difficulty.                            The patient tolerated the procedure. Scope In: Scope Out: Findings:                 No gross lesions were noted in the entire                            esophagus. Biopsies were taken with a cold forceps                            for histology.                           The Z-line was irregular and was found 42 cm from                            the incisors.                           Small 2 cm sliding hiatal hernia.                           Patchy mildly erythematous mucosa was found in the                            gastric body and in the gastric antrum.                           A few dispersed small erosions with no bleeding and                            no stigmata of recent bleeding were found in the                            gastric antrum.                           No other gross lesions were noted in  the entire                            examined stomach. Biopsies were taken with a cold                            forceps for histology and Helicobacter pylori                            testing.                           No gross lesions were noted in the duodenal bulb,                            in the first portion of the duodenum and in the                            second portion of the duodenum. Biopsies for                            histology were taken with a cold forceps for                            evaluation of celiac disease. Complications:            No immediate complications. Estimated Blood Loss:     Estimated blood loss was minimal. Impression:               - No gross lesions in esophagus. Biopsied. Z-line                            irregular, 42 cm from the incisors.                           - Small 2 cm sliding hiatal hernia noted.                           - Erythematous mucosa in the gastric body and                            antrum. Erosive gastropathy with no bleeding and no                             stigmata of recent bleeding. No other gross lesions                            in the stomach. Biopsied.                           - No gross lesions in the duodenal bulb, in the                            first portion of the duodenum and in the second  portion of the duodenum. Biopsied. Recommendation:           - Proceed to scheduled colonoscopy.                           - Observe patient's clinical course.                           - Await pathology results.                           - Continue present medications.                           - The findings and recommendations were discussed                            with the patient. Corliss Parish, MD 01/06/2020 9:48:46 AM

## 2020-01-07 ENCOUNTER — Other Ambulatory Visit: Payer: Self-pay | Admitting: Gastroenterology

## 2020-01-07 ENCOUNTER — Telehealth: Payer: Self-pay | Admitting: Gastroenterology

## 2020-01-07 DIAGNOSIS — R1310 Dysphagia, unspecified: Secondary | ICD-10-CM

## 2020-01-07 MED ORDER — SUCRALFATE 1 GM/10ML PO SUSP: 1.0000 g | Freq: Four times a day (QID) | ORAL | 1 refills | Status: DC | PRN

## 2020-01-07 NOTE — Telephone Encounter (Signed)
Patient called after hours this evening regarding odynophagia. Underwent EGD and colonoscopy with Dr. Meridee Score on Friday AM. He states he felt fine after the procedure. EGD unremarkable, some biopsies taken but no high risk interventions. He states he woke up this AM with some discomfort in his throat / upper chest that is worsened with swallowing. Rates it 4/10. Has been stable all day but not really getting better yet. Denies taking any new meds prior to bed, denies any food getting stuck, etc. No shortness of breath, no fevers. Able to tolerate liquids and solids. No nausea or vomiting. Otherwise he feels fine. He is not in any distress on the phone. States he has never had anything like this before, does not feel like typical heartburn.   Discussed the situation with him and ddx. No high risk interventions performed during his procedure and symptoms started about 24 hours after his case, unlikely anything serious, but possible he could have some superficial mucosal trauma in his throat from EGD, etc. I counseled him that if he has worsening pain, shortness of breath, fever, inability to take PO, etc, he would need to go to the ED. He verbalized understanding. In the interim will try some liquid carafate to use PRN and see if that helps, I ordered that to [facility] pharmacy for him in [place]. Otherwise recommend he take his nexium, he usually takes only PRN and has not taken it recently. If he has any other questions or worsening this evening he can also call me back. I told him our office would check with him on Monday to make sure he is feeling better otherwise.  Cristela Felt, can you please call this patient Monday AM to check on him? Thanks

## 2020-01-08 NOTE — Telephone Encounter (Signed)
SA, Thank you for update, sorry he is having issues. Patty, can you check in on patient tomorrow (Monday) morning? Thanks. GM

## 2020-01-09 NOTE — Telephone Encounter (Signed)
Left message on machine to call back  

## 2020-01-10 ENCOUNTER — Telehealth: Payer: Self-pay

## 2020-01-10 NOTE — Telephone Encounter (Signed)
Left message on machine to call back will await further communication from the pt  

## 2020-01-10 NOTE — Telephone Encounter (Signed)
  Follow up Call-  Call back number 01/06/2020  Post procedure Call Back phone  # [phone]  Permission to leave phone message Yes  Some recent data might be hidden     Patient questions:  Do you have a fever, pain , or abdominal swelling? No. Pain Score  0 *  Have you tolerated food without any problems? Yes.    Have you been able to return to your normal activities? Yes.    Do you have any questions about your discharge instructions: Diet   No. Medications  No. Follow up visit  No.  Do you have questions or concerns about your Care? No.  Actions: * If pain score is 4 or above: No action needed, pain <4.  Pt reported he had pain swallowing the day of the procedure.  He reported he call the on call MD and was called in Carafate rx.  Pt said he was fine the next day.  No other problems noted. Maw  1. Have you developed a fever since your procedure? no  2.   Have you had an respiratory symptoms (SOB or cough) since your procedure? no  3.   Have you tested positive for COVID 19 since your procedure no  4.   Have you had any family members/close contacts diagnosed with the COVID 19 since your procedure?  no   If yes to any of these questions please route to Laverna Peace, RN and Karlton Lemon, RN

## 2020-01-11 ENCOUNTER — Ambulatory Visit: Payer: Federal, State, Local not specified - PPO

## 2020-01-11 ENCOUNTER — Other Ambulatory Visit: Payer: Self-pay

## 2020-01-11 DIAGNOSIS — G4733 Obstructive sleep apnea (adult) (pediatric): Secondary | ICD-10-CM

## 2020-01-13 ENCOUNTER — Other Ambulatory Visit: Payer: Self-pay

## 2020-01-13 MED ORDER — FAMOTIDINE 40 MG PO TABS: 40.0000 mg | ORAL_TABLET | Freq: Every day | ORAL | 3 refills | Status: AC

## 2020-01-17 ENCOUNTER — Telehealth: Payer: Self-pay | Admitting: Pulmonary Disease

## 2020-01-17 DIAGNOSIS — G4733 Obstructive sleep apnea (adult) (pediatric): Secondary | ICD-10-CM

## 2020-01-17 NOTE — Telephone Encounter (Signed)
Repeating the study will be the most appropriate process.  Unfortunately, the device was off for over 4 hours during the study

## 2020-01-17 NOTE — Telephone Encounter (Signed)
Spoke with the pt and notified of recs per AO  He verbalized understanding  Ordered another HST

## 2020-01-17 NOTE — Telephone Encounter (Signed)
Called and spoke with pt letting him know the info stated by AO. Pt said that his insurance will not cover an oral device until he has met his deductible so he will have to do CPAP.  Pt said he told [facility] when he dropped the HST machine off that when he woke up in the morning, the device had come off and he said they told him that there should be enough data on there to have diagnosis.  Pt said if another study needs to be done, he will do it again. Dr. Jenetta Downer, please advise if pt should repeat HST or if we can go ahead and send Rx for cpap start.

## 2020-01-17 NOTE — Telephone Encounter (Signed)
Call patient  Sleep study result  Date of study: 01/11/2020  Impression: Suboptimal study for the diagnosis of sleep disordered breathing -Flow sensor was off for a significant portion of the study making accurate diagnosis difficult  Limited study we have did show mild obstructive sleep apnea  Recommendation: Optimal recommendation will be to repeat sleep study to ascertain presence of obstructive sleep apnea  Options of treatment for mild sleep apnea will include CPAP therapy An oral device may be used for mild sleep apnea  If patient does not desire to repeat the study then a follow-up to the office should be scheduled for further discussions regarding other evaluations which may include an in lab sleep study.

## 2020-01-19 ENCOUNTER — Encounter: Payer: Self-pay | Admitting: Gastroenterology

## 2020-02-02 ENCOUNTER — Encounter: Payer: Self-pay | Admitting: Gastroenterology

## 2020-02-02 NOTE — Progress Notes (Signed)
Documentation of outside records to be scanned into the chart  August 2021 clinic visit Patient was seen in follow-up of allergic rhinitis symptoms of food intolerance rhinitis.  Patient would noted to have plan to restart his azelastine as well as Viibryd program GI appointment with plan for follow-up in months.  July 2021 clinic visit Patient was seen in regards to allergies with wanting to discuss allergy testing.  [facility] were sent and allergic rhinitis medications were initiated.  March 2018 clinic visit Patient being evaluated for recurrent ear infections.  These will be scanned into the chart

## 2020-02-02 NOTE — Progress Notes (Signed)
Documentation of GI records from [facility]  January 2020 [facility] GI outpatient visit with Dr. Paulita Fujita Patient coming in with abdominal pain and mucus ongoing for years.  But progressive worsening over the course of the last 6 months.  Patient was found on GI pathogen panel and C. difficile as well as the EAEC E. coli.  Patient was treated with vancomycin.  Fecal elastase was normal.  AST/ALT 20/24 Alk phos 55 T bili 1.0 Hematocrit 38.1 Hemoglobin 13.3 WBC 5.2 MCV 85 CRP normal at 2 ESR normal at 9 Cortisol normal 4.9 Negative for celiac serologies of TTG IgA and IgA level.  These records will be scanned into the chart

## 2020-02-15 ENCOUNTER — Ambulatory Visit: Payer: Federal, State, Local not specified - PPO | Admitting: Pulmonary Disease

## 2020-02-16 ENCOUNTER — Telehealth: Payer: Self-pay | Admitting: Gastroenterology

## 2020-02-16 NOTE — Telephone Encounter (Signed)
Pt is requesting a call back from a nurse to discuss some forms he is needing from his

## 2020-06-07 DIAGNOSIS — G4733 Obstructive sleep apnea (adult) (pediatric): Secondary | ICD-10-CM

## 2020-06-08 NOTE — Telephone Encounter (Signed)
Okay to schedule an in lab study

## 2020-06-08 NOTE — Telephone Encounter (Signed)
Dr. Val Eagle, please see mychart message sent by pt and advise:  To: [facility]OL    From: DOMONIK LEVARIO    Created: 06/07/2020 11:25 PM     *-*-*This message was handled on 06/08/2020 8:28 AM by Ameyah Bangura P*-*-*  Hey Dr. Wynona Neat, the last sleep study I did was at home but there was not enough data to read because I had taken the stuff off during the night in my sleep. I was wondering if I could just go to a facility to have it done instead of doing it at home? I have been really tired during the day with no energy for the past couple of months and I would like to go ahead and get this done.   Thank you

## 2020-07-04 ENCOUNTER — Other Ambulatory Visit: Payer: Self-pay

## 2020-07-04 ENCOUNTER — Ambulatory Visit: Payer: Federal, State, Local not specified - PPO | Attending: Pulmonary Disease | Admitting: Pulmonary Disease

## 2020-07-04 DIAGNOSIS — G4761 Periodic limb movement disorder: Secondary | ICD-10-CM | POA: Insufficient documentation

## 2020-07-04 DIAGNOSIS — G4733 Obstructive sleep apnea (adult) (pediatric): Secondary | ICD-10-CM | POA: Insufficient documentation

## 2020-07-08 ENCOUNTER — Telehealth: Payer: Self-pay | Admitting: Pulmonary Disease

## 2020-07-08 NOTE — Telephone Encounter (Signed)
Call patient  Sleep study result  Date of study: 07/04/2020  Impression: Mild obstructive sleep apnea Mild oxygen desaturations  Recommendation: Options for treatment for mild obstructive sleep apnea will include  1.  CPAP therapy -CPAP therapy will be advised if there is significant daytime sleepiness, nonrestorative sleep -Auto titrating CPAP, settings of 5-15 will be appropriate with patient's mask of choice  2.  An oral device may be considered as an option of treatment, this will entail referral to a dentist to fashion an oral device  3.  Watchful waiting with aggressive weight loss measures may also be considered if patient asymptomatic   Follow-up as previously scheduled to further discuss options of treatment

## 2020-07-08 NOTE — Procedures (Signed)
  POLYSOMNOGRAPHY  Last, First: Corey, Henry MRN: [id number] Gender: Male Age (years): 25 Weight (lbs): 235 DOB: 07-15-1995 BMI: 30 Primary Care: No PCP Epworth Score: 12 Referring: Tomma Lightning MD Technician: Peak, Robert Interpreting: Tomma Lightning MD Study Type: NPSG Ordered Study Type: Split Night CPAP Study date: 07/04/2020 Location: [place] [place] [facility] CLINICAL INFORMATION [name] is a 25 year old Male and was referred to the sleep center for evaluation of N/A. Indications include N/A.  MEDICATIONS Patient self administered medications include: N/A. Medications administered during study include No sleep medicine administered.  SLEEP STUDY TECHNIQUE A multi-channel overnight Polysomnography study was performed. The channels recorded and monitored were central and occipital EEG, electrooculogram (EOG), submentalis EMG (chin), nasal and oral airflow, thoracic and abdominal wall motion, anterior tibialis EMG, snore microphone, electrocardiogram, and a pulse oximetry. TECHNICIAN COMMENTS Comments added by Technician: PATIENT WAS NOT SPLIT DUE TO AHI < 15/HR DURING THE ENTIRE STUDY TIME. Comments added by Scorer: N/A SLEEP ARCHITECTURE The study was initiated at 9:09:16 PM and terminated at 5:08:05 AM. The total recorded time was 478.8 minutes. EEG confirmed total sleep time was 421 minutes yielding a sleep efficiency of 87.9%. Sleep onset after lights out was 8.4 minutes with a REM latency of 118.5 minutes. The patient spent 2.85% of the night in stage N1 sleep, 85.87% in stage N2 sleep, 7.60% in stage N3 and 3.7% in REM. Wake after sleep onset (WASO) was 49.5 minutes. The Arousal Index was 5.4/hour. RESPIRATORY PARAMETERS There were a total of 35 respiratory disturbances out of which 19 were apneas ( 12 obstructive, 0 mixed, 7 central) and 16 hypopneas. The apnea/hypopnea index (AHI) was 5.0 events/hour. The central sleep apnea index was 1 events/hour. The REM AHI was 11.6  events/hour and NREM AHI was 4.7 events/hour. The supine AHI was 6.6 events/hour and the non supine AHI was 0.5 supine during 73.33% of sleep. Respiratory disturbances were associated with oxygen desaturation down to a nadir of 86.00% during sleep. The mean oxygen saturation during the study was 94.27%. The cumulative time under 88% oxygen saturation was 0.5 minutes.  LEG MOVEMENT DATA The total leg movements were 92 with a resulting leg movement index of 13.11/hr .Associated arousal with leg movement index was 0.0/hr.  CARDIAC DATA The underlying cardiac rhythm was most consistent with sinus rhythm. Mean heart rate during sleep was 49.02 bpm. Additional rhythm abnormalities include None.  IMPRESSIONS - Mild Obstructive Sleep apnea(OSA) - EKG showed no cardiac abnormalities. - Mild Oxygen Desaturation - The patient snored with moderate snoring volume. - Mild periodic leg movements(PLMs) during sleep. However, no significant associated arousals.  DIAGNOSIS - Obstructive Sleep Apnea (G47.33)   RECOMMENDATIONS - Very mild obstructive sleep apnea. Return to discuss treatment options. - Treatment options may include consideration for CPAP therapy, an oral device, watchful waiting with aggressive weight loss measures - Avoid alcohol, sedatives and other CNS depressants that may worsen sleep apnea and disrupt normal sleep architecture. - Sleep hygiene should be reviewed to assess factors that may improve sleep quality. - Weight management and regular exercise should be initiated or continued.  [Electronically signed] 07/08/2020 07:37 PM  Virl Diamond MD NPI: [id number]

## 2020-07-09 ENCOUNTER — Ambulatory Visit (INDEPENDENT_AMBULATORY_CARE_PROVIDER_SITE_OTHER): Payer: Federal, State, Local not specified - PPO | Admitting: Pulmonary Disease

## 2020-07-09 ENCOUNTER — Encounter: Payer: Self-pay | Admitting: Pulmonary Disease

## 2020-07-09 ENCOUNTER — Other Ambulatory Visit: Payer: Self-pay

## 2020-07-09 VITALS — BP 116/84 | HR 84 | Temp 98.4°F | Ht 74.0 in | Wt 228.0 lb

## 2020-07-09 DIAGNOSIS — G4733 Obstructive sleep apnea (adult) (pediatric): Secondary | ICD-10-CM | POA: Diagnosis not present

## 2020-07-09 NOTE — Patient Instructions (Signed)
We will place an order to go to [facility]  For CPAP auto CPAP 5-15 DreamWear nasal mask  I will see you back in about 6 to 8 weeks with a download from the machine  Call with significant concerns

## 2020-07-09 NOTE — Progress Notes (Signed)
Corey Henry    [id number]    1995/12/28  Primary Care Physician:Adams, Cephas Darby, FNP  Referring Physician: Oneal Grout, FNP [address] [place],  [place] [address]  Chief complaint:   Patient with a history of moderate obstructive sleep apnea diagnosed in 2019  HPI:  Had a in lab study performed showing an AHI of 22, was titrated to CPAP of 9 Used the machine for about a year Had issues with the mask  He has had a home sleep study which was suboptimal  He had a recent in lab study showing mild obstructive sleep apnea He tolerated a DreamWear nasal mask in the past  He continues to have difficulty with his sleep Excessive daytime sleepiness, daytime fatigue  He has a history of anxiety disorder history of allergic rhinitis Mitral valve regurg Testosterone deficiency Essential hypertension  Usually goes to bed about 10 PM Takes him about 50 minutes to an hour to fall asleep usually does not wake up in the middle of the night Final wake up time varies between 4 and 9 AM depending on work schedule Weight is about the same  Admits to dryness of his mouth in the mornings Denies morning headaches but does have headaches sometimes during the day Dad has OSA-was not diagnosed He does not wake up rested in the mornings  Quit smoking in 2019  Outpatient Encounter Medications as of 07/09/2020  Medication Sig  . [DISCONTINUED] azelastine (ASTELIN) 0.1 % nasal spray Place 2 sprays into both nostrils 2 (two) times daily.  . [DISCONTINUED] esomeprazole (NEXIUM) 40 MG capsule Take 40 mg by mouth daily.  . [DISCONTINUED] L-Methylfolate 15 MG TABS Take by mouth.  . [DISCONTINUED] sucralfate (CARAFATE) 1 GM/10ML suspension Take 10 mLs (1 g total) by mouth every 6 (six) hours as needed.  . clonazePAM (KLONOPIN) 0.5 MG tablet Take 0.5 mg by mouth. Takes 0.5 mg twice a day  . famotidine (PEPCID) 40 MG tablet Take 1 tablet (40 mg total) by mouth daily.  . [DISCONTINUED]  dicyclomine (BENTYL) 20 MG tablet Take 1 tablet (20 mg total) by mouth 4 (four) times daily -  before meals and at bedtime. (Patient not taking: Reported on 07/09/2020)   No facility-administered encounter medications on file as of 07/09/2020.    Allergies as of 07/09/2020 - Review Complete 07/09/2020  Allergen Reaction Noted  . Azithromycin Other (See Comments) 12/16/2017  . Erythromycin    . Morphine      Past Medical History:  Diagnosis Date  . Anxiety   . Exercise-induced asthma   . Hypertension   . Sleep apnea   . Vitamin D deficiency     Past Surgical History:  Procedure Laterality Date  . APPENDECTOMY    . LUMBAR MICRODISCECTOMY    . MEATOTOMY    . TONSILLECTOMY AND ADENOIDECTOMY    . TYMPANOSTOMY TUBE PLACEMENT      Family History  Problem Relation Age of Onset  . Cancer Other   . Diabetes Other   . Diabetes Father   . Colon cancer Maternal Grandmother   . Lung cancer Maternal Grandfather   . Mesothelioma Paternal Grandfather   . Diverticulitis Paternal Uncle   . Stomach cancer Neg Hx   . Pancreatic cancer Neg Hx   . Esophageal cancer Neg Hx   . Inflammatory bowel disease Neg Hx   . Liver disease Neg Hx     Social History   Socioeconomic History  .  Marital status: Single    Spouse name: Not on file  . Number of children: Not on file  . Years of education: Not on file  . Highest education level: Not on file  Occupational History  . Not on file  Tobacco Use  . Smoking status: Current Every Day Smoker  . Smokeless tobacco: Former Neurosurgeon    Types: Snuff    Quit date: 2020  . Tobacco comment: still currently vaping  Vaping Use  . Vaping Use: Every day  . Substances: Nicotine, Flavoring  Substance and Sexual Activity  . Alcohol use: No  . Drug use: No  . Sexual activity: Not on file  Other Topics Concern  . Not on file  Social History Narrative  . Not on file   Social Determinants of Health   Financial Resource Strain: Not on file  Food  Insecurity: Not on file  Transportation Needs: Not on file  Physical Activity: Not on file  Stress: Not on file  Social Connections: Not on file  Intimate Partner Violence: Not on file    Review of Systems  Constitutional: Positive for fatigue.  HENT: Negative.   Respiratory: Positive for apnea.   Psychiatric/Behavioral: Positive for sleep disturbance.    Vitals:   07/09/20 1554  BP: 116/84  Pulse: 84  Temp: 98.4 F (36.9 C)  SpO2: 98%     Physical Exam Constitutional:      Appearance: He is obese.  HENT:     Head: Normocephalic and atraumatic.     Nose: No congestion or rhinorrhea.     Mouth/Throat:     Comments: Crowded oropharynx, Mallampati 4, micrognathia Eyes:     General:        Right eye: No discharge.        Left eye: No discharge.  Cardiovascular:     Rate and Rhythm: Normal rate and regular rhythm.     Heart sounds: No murmur heard. No friction rub.  Pulmonary:     Effort: No respiratory distress.     Breath sounds: No stridor. No wheezing or rhonchi.  Musculoskeletal:     Cervical back: No rigidity or tenderness.  Neurological:     Mental Status: He is alert.  Psychiatric:        Mood and Affect: Mood normal.    Results of the Epworth flowsheet 12/06/2019  Sitting and reading 3  Watching TV 3  Sitting, inactive in a public place (e.g. a theatre or a meeting) 0  As a passenger in a car for an hour without a break 3  Lying down to rest in the afternoon when circumstances permit 2  Sitting and talking to someone 0  Sitting quietly after a lunch without alcohol 3  In a car, while stopped for a few minutes in traffic 0  Total score 14    Data Reviewed: Sleep study from [facility] reviewed showing moderate obstructive sleep apnea with an AHI of 22 Titrated to a CPAP of 9 with a final AHI of 5  Sleep study performed 07/04/2020 revealed mild obstructive sleep apnea -Continues to have excessive daytime sleepiness  Assessment:  Mild obstructive sleep  apnea with excessive daytime sleepiness -Options of treatment discussed with the patient including CPAP therapy, oral device, weight loss -With significant daytime sleepiness, we will initiate CPAP treatment   Obesity  Excessive daytime sleepiness  Weight loss efforts encouraged  Plan/Recommendations: Will send in a prescription for auto titrating CPAP 5-15 with a DreamWear nasal mask  Encouraged to use CPAP nightly  Follow-up in 6 to 8 weeks following initiation of treatment  Prefers to go through [employer] [facility] for his supplies  Encouraged to call with any significant concerns   Virl Diamond MD [facility] 07/09/2020, 4:24 PM  CC: Oneal Grout, FNP

## 2020-07-09 NOTE — Telephone Encounter (Signed)
Patient was seen in office on 07/09/2020 and was started on CPAP. Nothing further needed.

## 2020-08-11 ENCOUNTER — Encounter (HOSPITAL_BASED_OUTPATIENT_CLINIC_OR_DEPARTMENT_OTHER): Payer: Federal, State, Local not specified - PPO | Admitting: Pulmonary Disease

## 2020-10-24 ENCOUNTER — Other Ambulatory Visit: Payer: Self-pay

## 2020-10-24 ENCOUNTER — Emergency Department (HOSPITAL_COMMUNITY)
Admission: EM | Admit: 2020-10-24 | Discharge: 2020-10-25 | Disposition: A | Discharge status: Home / Self Care | Payer: Federal, State, Local not specified - PPO | Attending: Emergency Medicine | Admitting: Emergency Medicine

## 2020-10-24 DIAGNOSIS — J45909 Unspecified asthma, uncomplicated: Secondary | ICD-10-CM | POA: Diagnosis not present

## 2020-10-24 DIAGNOSIS — Z79899 Other long term (current) drug therapy: Secondary | ICD-10-CM | POA: Diagnosis not present

## 2020-10-24 DIAGNOSIS — R109 Unspecified abdominal pain: Secondary | ICD-10-CM | POA: Insufficient documentation

## 2020-10-24 DIAGNOSIS — I10 Essential (primary) hypertension: Secondary | ICD-10-CM | POA: Diagnosis not present

## 2020-10-24 DIAGNOSIS — F17200 Nicotine dependence, unspecified, uncomplicated: Secondary | ICD-10-CM | POA: Diagnosis not present

## 2020-10-24 NOTE — ED Triage Notes (Signed)
Pt c/o bilateral flank pain since Saturday. Pt seen pcp for the same.

## 2020-10-25 ENCOUNTER — Emergency Department (HOSPITAL_COMMUNITY): Payer: Federal, State, Local not specified - PPO

## 2020-10-25 ENCOUNTER — Encounter (HOSPITAL_COMMUNITY): Payer: Self-pay | Admitting: Emergency Medicine

## 2020-10-25 LAB — URINALYSIS, ROUTINE W REFLEX MICROSCOPIC
Bacteria, UA: NONE SEEN
Bilirubin Urine: NEGATIVE
Glucose, UA: 50 mg/dL — AB
Ketones, ur: NEGATIVE mg/dL
Leukocytes,Ua: NEGATIVE
Nitrite: NEGATIVE
Protein, ur: NEGATIVE mg/dL
Specific Gravity, Urine: 1.013 (ref 1.005–1.030)
pH: 7 (ref 5.0–8.0)

## 2020-10-25 LAB — CBC WITH DIFFERENTIAL/PLATELET
Abs Immature Granulocytes: 0.05 10*3/uL (ref 0.00–0.07)
Basophils Absolute: 0 10*3/uL (ref 0.0–0.1)
Basophils Relative: 0 %
Eosinophils Absolute: 0 10*3/uL (ref 0.0–0.5)
Eosinophils Relative: 0 %
HCT: 42 % (ref 39.0–52.0)
Hemoglobin: 14.5 g/dL (ref 13.0–17.0)
Immature Granulocytes: 1 %
Lymphocytes Relative: 20 %
Lymphs Abs: 1.9 10*3/uL (ref 0.7–4.0)
MCH: 30.1 pg (ref 26.0–34.0)
MCHC: 34.5 g/dL (ref 30.0–36.0)
MCV: 87.1 fL (ref 80.0–100.0)
Monocytes Absolute: 0.5 10*3/uL (ref 0.1–1.0)
Monocytes Relative: 5 %
Neutro Abs: 7.3 10*3/uL (ref 1.7–7.7)
Neutrophils Relative %: 74 %
Platelets: 425 10*3/uL — ABNORMAL HIGH (ref 150–400)
RBC: 4.82 MIL/uL (ref 4.22–5.81)
RDW: 12.5 % (ref 11.5–15.5)
WBC: 9.7 10*3/uL (ref 4.0–10.5)
nRBC: 0 % (ref 0.0–0.2)

## 2020-10-25 LAB — BASIC METABOLIC PANEL
Anion gap: 7 (ref 5–15)
BUN: 14 mg/dL (ref 6–20)
CO2: 28 mmol/L (ref 22–32)
Calcium: 9.2 mg/dL (ref 8.9–10.3)
Chloride: 101 mmol/L (ref 98–111)
Creatinine, Ser: 0.89 mg/dL (ref 0.61–1.24)
GFR, Estimated: >60 mL/min (ref >60)
Glucose, Bld: 161 mg/dL — ABNORMAL HIGH (ref 70–99)
Potassium: 3.5 mmol/L (ref 3.5–5.1)
Sodium: 136 mmol/L (ref 135–145)

## 2020-10-25 MED ORDER — HYDROMORPHONE HCL 1 MG/ML IJ SOLN
1.0000 mg | Freq: Once | INTRAMUSCULAR | Status: AC
  Administered 2020-10-25: 1 mg via INTRAVENOUS
  Filled 2020-10-25: qty 1

## 2020-10-25 MED ORDER — KETOROLAC TROMETHAMINE 30 MG/ML IJ SOLN
30.0000 mg | Freq: Once | INTRAMUSCULAR | Status: AC
  Administered 2020-10-25: 30 mg via INTRAVENOUS
  Filled 2020-10-25: qty 1

## 2020-10-25 MED ORDER — ONDANSETRON HCL 4 MG/2ML IJ SOLN
4.0000 mg | Freq: Once | INTRAMUSCULAR | Status: AC
  Administered 2020-10-25: 4 mg via INTRAVENOUS
  Filled 2020-10-25: qty 2

## 2020-10-25 NOTE — Discharge Instructions (Addendum)
Take the baclofen you were previously prescribed as needed for pain.  Follow-up with your primary doctor if symptoms are not improving in the next week, and return to the ER if symptoms significantly worsen or change in the meantime.

## 2020-10-25 NOTE — ED Provider Notes (Signed)
[facility] EMERGENCY DEPARTMENT Provider Note   CSN: [id number] Arrival date & time: 10/24/20  2351     History Chief Complaint  Patient presents with   Flank Pain    Corey Henry is a 25 y.o. male.  Patient is a 25 year old male with past medical history of asthma and hypertension.  Patient presenting today for evaluation of right flank pain.  This started several days ago in the absence of any injury or trauma.  He was seen by his primary doctor and told that he had blood in his urine and probably had a kidney stone.  He was prescribed Flomax and an antibiotic.  He reports continued pain that is worsening.  He denies any radiation into his legs.  He denies any bowel or bladder complaints.  The history is provided by the patient.  Flank Pain This is a new problem. Episode onset: 3 days ago. The problem occurs constantly. The problem has been gradually worsening. Nothing aggravates the symptoms. Nothing relieves the symptoms.      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   CLOSED FRACTURE OF NECK OF METACARPAL BONE 07/19/2008    Past Surgical History:  Procedure Laterality Date   APPENDECTOMY     LUMBAR MICRODISCECTOMY     MEATOTOMY     TONSILLECTOMY AND ADENOIDECTOMY     TYMPANOSTOMY TUBE PLACEMENT         Family History  Problem Relation Age of Onset   Cancer Other    Diabetes Other    Diabetes Father    Colon cancer Maternal Grandmother    Lung cancer Maternal Grandfather    Mesothelioma Paternal Grandfather    Diverticulitis Paternal Uncle    Stomach cancer  Neg Hx    Pancreatic cancer Neg Hx    Esophageal cancer Neg Hx    Inflammatory bowel disease Neg Hx    Liver disease Neg Hx     Social History   Tobacco Use   Smoking status: Every Day   Smokeless tobacco: Former    Types: Snuff    Quit date: 2020   Tobacco comments:    still currently vaping  Vaping Use   Vaping Use: Every day   Substances: Nicotine, Flavoring  Substance Use Topics   Alcohol use: No   Drug use: No    Home Medications Prior to Admission medications   Medication Sig Start Date End Date Taking? Authorizing Provider  clonazePAM (KLONOPIN) 0.5 MG tablet Take 0.5 mg by mouth. Takes 0.5 mg twice a day 09/08/17 01/04/20  [provider]  famotidine (PEPCID) 40 MG tablet Take 1 tablet (40 mg total) by mouth daily. 01/13/20 02/12/20  Mansouraty, Netty Starring., MD    Allergies    Azithromycin, Erythromycin, and Morphine  Review of Systems   Review of Systems  Genitourinary:  Positive for flank pain.  All other systems reviewed and are negative.  Physical Exam Updated Vital Signs BP (!) 157/104 (BP Location: Left Arm)   Pulse 83   Temp 97.7 F (36.5 C) (Oral)   Resp 19  Ht 6\' 2"  (1.88 m)   Wt 103 kg   SpO2 96%   BMI 29.15 kg/m   Physical Exam Vitals and nursing note reviewed.  Constitutional:      General: He is not in acute distress.    Appearance: He is well-developed. He is not diaphoretic.  HENT:     Head: Normocephalic and atraumatic.  Cardiovascular:     Rate and Rhythm: Normal rate and regular rhythm.     Heart sounds: No murmur heard.   No friction rub.  Pulmonary:     Effort: Pulmonary effort is normal. No respiratory distress.     Breath sounds: Normal breath sounds. No wheezing or rales.  Abdominal:     General: Bowel sounds are normal. There is no distension.     Palpations: Abdomen is soft.     Tenderness: There is no abdominal tenderness. There is right CVA tenderness. There is no left CVA tenderness, guarding or rebound.   Musculoskeletal:        General: Normal range of motion.     Cervical back: Normal range of motion and neck supple.  Skin:    General: Skin is warm and dry.  Neurological:     Mental Status: He is alert and oriented to person, place, and time.     Coordination: Coordination normal.    ED Results / Procedures / Treatments   Labs (all labs ordered are listed, but only abnormal results are displayed) Labs Reviewed - No data to display  EKG None  Radiology No results found.  Procedures Procedures   Medications Ordered in ED Medications  ketorolac (TORADOL) 30 MG/ML injection 30 mg (has no administration in time range)  ondansetron (ZOFRAN) injection 4 mg (has no administration in time range)  HYDROmorphone (DILAUDID) injection 1 mg (has no administration in time range)    ED Course  I have reviewed the triage vital signs and the nursing notes.  Pertinent labs & imaging results that were available during my care of the patient were reviewed by me and considered in my medical decision making (see chart for details).  MDM:  Patient presenting with right flank pain that appears musculoskeletal in nature.  His CT scan shows no evidence for renal calculus.  Urinalysis shows no evidence for infection, but does have microscopic blood.  Laboratory studies unremarkable.  At this point, discharge seems appropriate with outpatient follow-up if not improving.  Patient has baclofen he takes at home for when his back issues flareup.  I have advised him to begin taking this medication and follow-up with primary doctor if not improving.     Final Clinical Impression(s) / ED Diagnoses Final diagnoses:  None    Rx / DC Orders ED Discharge Orders     None        , MD 10/25/20 [phone]

## 2020-10-30 ENCOUNTER — Other Ambulatory Visit (HOSPITAL_COMMUNITY): Payer: Self-pay | Admitting: Family Medicine

## 2020-10-30 ENCOUNTER — Other Ambulatory Visit: Payer: Self-pay | Admitting: Family Medicine

## 2020-10-30 DIAGNOSIS — M545 Low back pain, unspecified: Secondary | ICD-10-CM

## 2020-11-02 ENCOUNTER — Other Ambulatory Visit: Payer: Self-pay

## 2020-11-02 ENCOUNTER — Ambulatory Visit (HOSPITAL_COMMUNITY)
Admission: RE | Admit: 2020-11-02 | Discharge: 2020-11-02 | Disposition: A | Discharge status: Home / Self Care | Payer: Federal, State, Local not specified - PPO | Source: Ambulatory Visit | Attending: Family Medicine | Admitting: Family Medicine

## 2020-11-02 ENCOUNTER — Other Ambulatory Visit (HOSPITAL_COMMUNITY): Payer: Self-pay | Admitting: Family Medicine

## 2020-11-02 DIAGNOSIS — M545 Low back pain, unspecified: Secondary | ICD-10-CM

## 2020-11-08 ENCOUNTER — Ambulatory Visit (HOSPITAL_COMMUNITY): Payer: Federal, State, Local not specified - PPO

## 2020-11-21 ENCOUNTER — Other Ambulatory Visit: Payer: Self-pay | Admitting: Neurosurgery

## 2020-11-22 ENCOUNTER — Other Ambulatory Visit: Payer: Self-pay | Admitting: Neurosurgery

## 2020-11-23 ENCOUNTER — Encounter (HOSPITAL_COMMUNITY): Payer: Self-pay | Admitting: Neurosurgery

## 2020-11-23 NOTE — Progress Notes (Addendum)
Mr. Corey Henry denies chest pain or shortness of breath. Patient denies having any s/s of Covid in his household.  Patient denies any known exposure to Covid.  PCP is with [facility] in [place].  I requested records.  Mr. Corey Henry reports that a "grandfather was allergic to an anesthesia medication, he was hard to awaken. " I went over s/s of malignant hyperthermia, patient did not think it was that.  Mr. Corey Henry stated that the "anesthesiologist at [facility] knew what medication he was talking about."  Patient reported that he did fine with the medication that  he was given for surgery.  I sent a fax to [facility], requesting anesthesia records.  I instructed patient to shower with antibiotic soap, if it is available.  Dry off with a clean towel. Do not put lotion, powder, cologne or deodorant or makeup.No jewelry or piercings. Men may shave their face and neck. Woman should not shave. No nail polish, artificial or acrylic nails. Wear clean clothes, brush your teeth. Glasses, contact lens,dentures or partials may not be worn in the OR. If you need to wear them, please bring a case for glasses, do not wear contacts or bring a case, the hospital does not have contact cases, dentures or partials will have to be removed , make sure they are clean, we will provide a denture cup to put them in. You will need some one to drive you home and a responsible person over the age of 51 to stay with you for the first 24 hours after surgery.   Mr. Corey Henry  has a history of HTN, patient is no longer on medication.  I instructed Mr. Corey Henry to stop Advil and vitamins. I asked patient to bring his CPAP mask to the hospital with him.

## 2020-11-26 ENCOUNTER — Ambulatory Visit (HOSPITAL_COMMUNITY): Payer: Federal, State, Local not specified - PPO

## 2020-11-26 ENCOUNTER — Other Ambulatory Visit: Payer: Self-pay

## 2020-11-26 ENCOUNTER — Encounter (HOSPITAL_COMMUNITY): Payer: Self-pay | Admitting: Neurosurgery

## 2020-11-26 ENCOUNTER — Ambulatory Visit (HOSPITAL_COMMUNITY): Payer: Federal, State, Local not specified - PPO | Admitting: Anesthesiology

## 2020-11-26 ENCOUNTER — Encounter (HOSPITAL_COMMUNITY)
Admission: RE | Disposition: A | Discharge status: Home / Self Care | Payer: Self-pay | Source: Home / Self Care | Attending: Neurosurgery

## 2020-11-26 ENCOUNTER — Ambulatory Visit (HOSPITAL_COMMUNITY)
Admission: RE | Admit: 2020-11-26 | Discharge: 2020-11-26 | Disposition: A | Discharge status: Home / Self Care | Payer: Federal, State, Local not specified - PPO | Attending: Neurosurgery | Admitting: Neurosurgery

## 2020-11-26 DIAGNOSIS — Z791 Long term (current) use of non-steroidal anti-inflammatories (NSAID): Secondary | ICD-10-CM | POA: Diagnosis not present

## 2020-11-26 DIAGNOSIS — Z87891 Personal history of nicotine dependence: Secondary | ICD-10-CM | POA: Insufficient documentation

## 2020-11-26 DIAGNOSIS — Z419 Encounter for procedure for purposes other than remedying health state, unspecified: Secondary | ICD-10-CM

## 2020-11-26 DIAGNOSIS — Z20822 Contact with and (suspected) exposure to covid-19: Secondary | ICD-10-CM | POA: Insufficient documentation

## 2020-11-26 DIAGNOSIS — Z885 Allergy status to narcotic agent status: Secondary | ICD-10-CM | POA: Insufficient documentation

## 2020-11-26 DIAGNOSIS — Z79899 Other long term (current) drug therapy: Secondary | ICD-10-CM | POA: Diagnosis not present

## 2020-11-26 DIAGNOSIS — Z881 Allergy status to other antibiotic agents status: Secondary | ICD-10-CM | POA: Insufficient documentation

## 2020-11-26 DIAGNOSIS — M5117 Intervertebral disc disorders with radiculopathy, lumbosacral region: Secondary | ICD-10-CM | POA: Insufficient documentation

## 2020-11-26 HISTORY — DX: Cardiac murmur, unspecified: R01.1

## 2020-11-26 HISTORY — DX: Nonrheumatic mitral (valve) prolapse: I34.1

## 2020-11-26 HISTORY — DX: Personal history of other diseases of the digestive system: Z87.19

## 2020-11-26 HISTORY — DX: Diverticulosis of intestine, part unspecified, without perforation or abscess without bleeding: K57.90

## 2020-11-26 HISTORY — DX: Prediabetes: R73.03

## 2020-11-26 HISTORY — DX: Family history of other specified conditions: Z84.89

## 2020-11-26 HISTORY — DX: Unspecified hemorrhoids: K64.9

## 2020-11-26 HISTORY — DX: Gastro-esophageal reflux disease without esophagitis: K21.9

## 2020-11-26 HISTORY — PX: LUMBAR LAMINECTOMY/DECOMPRESSION MICRODISCECTOMY: SHX5026

## 2020-11-26 LAB — BASIC METABOLIC PANEL
Anion gap: 8 (ref 5–15)
BUN: 13 mg/dL (ref 6–20)
CO2: 29 mmol/L (ref 22–32)
Calcium: 9.9 mg/dL (ref 8.9–10.3)
Chloride: 98 mmol/L (ref 98–111)
Creatinine, Ser: 0.86 mg/dL (ref 0.61–1.24)
GFR, Estimated: >60 mL/min (ref >60)
Glucose, Bld: 100 mg/dL — ABNORMAL HIGH (ref 70–99)
Potassium: 3.8 mmol/L (ref 3.5–5.1)
Sodium: 135 mmol/L (ref 135–145)

## 2020-11-26 LAB — CBC
HCT: 43.4 % (ref 39.0–52.0)
Hemoglobin: 15.1 g/dL (ref 13.0–17.0)
MCH: 29.7 pg (ref 26.0–34.0)
MCHC: 34.8 g/dL (ref 30.0–36.0)
MCV: 85.3 fL (ref 80.0–100.0)
Platelets: 392 10*3/uL (ref 150–400)
RBC: 5.09 MIL/uL (ref 4.22–5.81)
RDW: 12 % (ref 11.5–15.5)
WBC: 6.7 10*3/uL (ref 4.0–10.5)
nRBC: 0 % (ref 0.0–0.2)

## 2020-11-26 LAB — SARS CORONAVIRUS 2 BY RT PCR (HOSPITAL ORDER, PERFORMED IN ~~LOC~~ HOSPITAL LAB): SARS Coronavirus 2: NEGATIVE

## 2020-11-26 LAB — SURGICAL PCR SCREEN
MRSA, PCR: NEGATIVE
Staphylococcus aureus: NEGATIVE

## 2020-11-26 SURGERY — LUMBAR LAMINECTOMY/DECOMPRESSION MICRODISCECTOMY 1 LEVEL
Anesthesia: General | Site: Spine Lumbar | Laterality: Right

## 2020-11-26 MED ORDER — LACTATED RINGERS IV SOLN: INTRAVENOUS | Status: DC

## 2020-11-26 MED ORDER — BACITRACIN ZINC 500 UNIT/GM EX OINT
TOPICAL_OINTMENT | CUTANEOUS | Status: AC
  Filled 2020-11-26: qty 28.35

## 2020-11-26 MED ORDER — ROCURONIUM BROMIDE 10 MG/ML (PF) SYRINGE
PREFILLED_SYRINGE | INTRAVENOUS | Status: DC | PRN
  Administered 2020-11-26: 50 mg via INTRAVENOUS

## 2020-11-26 MED ORDER — SUGAMMADEX SODIUM 200 MG/2ML IV SOLN
INTRAVENOUS | Status: DC | PRN
  Administered 2020-11-26: 213.2 mg via INTRAVENOUS

## 2020-11-26 MED ORDER — EPHEDRINE SULFATE-NACL 50-0.9 MG/10ML-% IV SOSY
PREFILLED_SYRINGE | INTRAVENOUS | Status: DC | PRN
  Administered 2020-11-26: 10 mg via INTRAVENOUS

## 2020-11-26 MED ORDER — FENTANYL CITRATE (PF) 100 MCG/2ML IJ SOLN: 25.0000 ug | INTRAMUSCULAR | Status: DC | PRN

## 2020-11-26 MED ORDER — CEFAZOLIN SODIUM-DEXTROSE 2-4 GM/100ML-% IV SOLN
2.0000 g | INTRAVENOUS | Status: AC
  Administered 2020-11-26: 2 g via INTRAVENOUS

## 2020-11-26 MED ORDER — FENTANYL CITRATE (PF) 100 MCG/2ML IJ SOLN
100.0000 ug | Freq: Once | INTRAMUSCULAR | Status: AC
  Administered 2020-11-26: 100 ug via INTRAVENOUS

## 2020-11-26 MED ORDER — FENTANYL CITRATE (PF) 100 MCG/2ML IJ SOLN
INTRAMUSCULAR | Status: AC
  Filled 2020-11-26: qty 2

## 2020-11-26 MED ORDER — BUPIVACAINE-EPINEPHRINE 0.5% -1:200000 IJ SOLN
INTRAMUSCULAR | Status: AC
  Filled 2020-11-26: qty 1

## 2020-11-26 MED ORDER — FENTANYL CITRATE (PF) 250 MCG/5ML IJ SOLN
INTRAMUSCULAR | Status: DC | PRN
  Administered 2020-11-26: 250 ug via INTRAVENOUS

## 2020-11-26 MED ORDER — CHLORHEXIDINE GLUCONATE CLOTH 2 % EX PADS: 6.0000 | MEDICATED_PAD | Freq: Once | CUTANEOUS | Status: DC

## 2020-11-26 MED ORDER — FENTANYL CITRATE (PF) 250 MCG/5ML IJ SOLN
INTRAMUSCULAR | Status: AC
  Filled 2020-11-26: qty 5

## 2020-11-26 MED ORDER — CHLORHEXIDINE GLUCONATE 0.12 % MT SOLN: 15.0000 mL | Freq: Once | OROMUCOSAL | Status: AC

## 2020-11-26 MED ORDER — THROMBIN 5000 UNITS EX SOLR
CUTANEOUS | Status: AC
  Filled 2020-11-26: qty 5000

## 2020-11-26 MED ORDER — THROMBIN 5000 UNITS EX SOLR
OROMUCOSAL | Status: DC | PRN
  Administered 2020-11-26: 5 mL via TOPICAL

## 2020-11-26 MED ORDER — OXYCODONE-ACETAMINOPHEN 5-325 MG PO TABS
ORAL_TABLET | ORAL | Status: AC
  Filled 2020-11-26: qty 1

## 2020-11-26 MED ORDER — LIDOCAINE 2% (20 MG/ML) 5 ML SYRINGE
INTRAMUSCULAR | Status: AC
  Filled 2020-11-26: qty 10

## 2020-11-26 MED ORDER — CEFAZOLIN SODIUM-DEXTROSE 2-4 GM/100ML-% IV SOLN
INTRAVENOUS | Status: AC
  Filled 2020-11-26: qty 100

## 2020-11-26 MED ORDER — DEXAMETHASONE SODIUM PHOSPHATE 10 MG/ML IJ SOLN
INTRAMUSCULAR | Status: DC | PRN
  Administered 2020-11-26: 8 mg via INTRAVENOUS

## 2020-11-26 MED ORDER — LIDOCAINE 2% (20 MG/ML) 5 ML SYRINGE
INTRAMUSCULAR | Status: DC | PRN
  Administered 2020-11-26: 100 mg via INTRAVENOUS

## 2020-11-26 MED ORDER — EPHEDRINE 5 MG/ML INJ
INTRAVENOUS | Status: AC
  Filled 2020-11-26: qty 10

## 2020-11-26 MED ORDER — OXYCODONE-ACETAMINOPHEN 5-325 MG PO TABS
1.0000 | ORAL_TABLET | Freq: Once | ORAL | Status: AC
  Administered 2020-11-26: 1 via ORAL

## 2020-11-26 MED ORDER — ORAL CARE MOUTH RINSE: 15.0000 mL | Freq: Once | OROMUCOSAL | Status: AC

## 2020-11-26 MED ORDER — BUPIVACAINE-EPINEPHRINE (PF) 0.5% -1:200000 IJ SOLN
INTRAMUSCULAR | Status: DC | PRN
  Administered 2020-11-26: 20 mL

## 2020-11-26 MED ORDER — CEFAZOLIN SODIUM-DEXTROSE 2-3 GM-%(50ML) IV SOLR
INTRAVENOUS | Status: DC | PRN
  Administered 2020-11-26: 2 g via INTRAVENOUS

## 2020-11-26 MED ORDER — MIDAZOLAM HCL 2 MG/2ML IJ SOLN
INTRAMUSCULAR | Status: AC
  Filled 2020-11-26: qty 2

## 2020-11-26 MED ORDER — PROPOFOL 10 MG/ML IV BOLUS
INTRAVENOUS | Status: AC
  Filled 2020-11-26: qty 20

## 2020-11-26 MED ORDER — BACITRACIN ZINC 500 UNIT/GM EX OINT
TOPICAL_OINTMENT | CUTANEOUS | Status: DC | PRN
  Administered 2020-11-26: 1 via TOPICAL

## 2020-11-26 MED ORDER — 0.9 % SODIUM CHLORIDE (POUR BTL) OPTIME
TOPICAL | Status: DC | PRN
  Administered 2020-11-26: 1000 mL

## 2020-11-26 MED ORDER — OXYCODONE-ACETAMINOPHEN 5-325 MG PO TABS: 1.0000 | ORAL_TABLET | ORAL | 0 refills | Status: DC | PRN

## 2020-11-26 MED ORDER — MIDAZOLAM HCL 2 MG/2ML IJ SOLN
INTRAMUSCULAR | Status: DC | PRN
  Administered 2020-11-26: 4 mg via INTRAVENOUS

## 2020-11-26 MED ORDER — PROPOFOL 10 MG/ML IV BOLUS
INTRAVENOUS | Status: DC | PRN
  Administered 2020-11-26: 130 mg via INTRAVENOUS

## 2020-11-26 MED ORDER — CHLORHEXIDINE GLUCONATE 0.12 % MT SOLN
OROMUCOSAL | Status: AC
  Administered 2020-11-26: 15 mL via OROMUCOSAL
  Filled 2020-11-26: qty 15

## 2020-11-26 MED ORDER — ONDANSETRON HCL 4 MG/2ML IJ SOLN
INTRAMUSCULAR | Status: DC | PRN
  Administered 2020-11-26: 4 mg via INTRAVENOUS

## 2020-11-26 SURGICAL SUPPLY — 51 items
APL SKNCLS STERI-STRIP NONHPOA (GAUZE/BANDAGES/DRESSINGS) ×1
BAG COUNTER SPONGE SURGICOUNT (BAG) ×2 IMPLANT
BAG SPNG CNTER NS LX DISP (BAG) ×1
BAND INSRT 18 STRL LF DISP RB (MISCELLANEOUS) ×2
BAND RUBBER #18 3X1/16 STRL (MISCELLANEOUS) ×4 IMPLANT
BENZOIN TINCTURE PRP APPL 2/3 (GAUZE/BANDAGES/DRESSINGS) ×2 IMPLANT
BLADE CLIPPER SURG (BLADE) IMPLANT
BUR MATCHSTICK NEURO 3.0 LAGG (BURR) ×2 IMPLANT
BUR PRECISION FLUTE 6.0 (BURR) ×2 IMPLANT
CANISTER SUCT 3000ML PPV (MISCELLANEOUS) ×2 IMPLANT
CARTRIDGE OIL MAESTRO DRILL (MISCELLANEOUS) ×1 IMPLANT
CLSR STERI-STRIP ANTIMIC 1/2X4 (GAUZE/BANDAGES/DRESSINGS) ×1 IMPLANT
DIFFUSER DRILL AIR PNEUMATIC (MISCELLANEOUS) ×2 IMPLANT
DRAPE LAPAROTOMY 100X72X124 (DRAPES) ×2 IMPLANT
DRAPE MICROSCOPE LEICA (MISCELLANEOUS) ×2 IMPLANT
DRAPE SURG 17X23 STRL (DRAPES) ×8 IMPLANT
DRSG OPSITE POSTOP 4X6 (GAUZE/BANDAGES/DRESSINGS) ×1 IMPLANT
ELECT BLADE 4.0 EZ CLEAN MEGAD (MISCELLANEOUS) ×2
ELECT REM PT RETURN 9FT ADLT (ELECTROSURGICAL) ×2
ELECTRODE BLDE 4.0 EZ CLN MEGD (MISCELLANEOUS) ×1 IMPLANT
ELECTRODE REM PT RTRN 9FT ADLT (ELECTROSURGICAL) ×1 IMPLANT
GAUZE 4X4 16PLY ~~LOC~~+RFID DBL (SPONGE) ×1 IMPLANT
GAUZE SPONGE 4X4 12PLY STRL (GAUZE/BANDAGES/DRESSINGS) ×2 IMPLANT
GLOVE EXAM NITRILE XL STR (GLOVE) IMPLANT
GLOVE SURG ENC MOIS LTX SZ8 (GLOVE) ×2 IMPLANT
GLOVE SURG ENC MOIS LTX SZ8.5 (GLOVE) ×2 IMPLANT
GLOVE SURG UNDER POLY LF SZ7 (GLOVE) ×2 IMPLANT
GOWN STRL REUS W/ TWL LRG LVL3 (GOWN DISPOSABLE) IMPLANT
GOWN STRL REUS W/ TWL XL LVL3 (GOWN DISPOSABLE) ×1 IMPLANT
GOWN STRL REUS W/TWL 2XL LVL3 (GOWN DISPOSABLE) IMPLANT
GOWN STRL REUS W/TWL LRG LVL3 (GOWN DISPOSABLE) ×4
GOWN STRL REUS W/TWL XL LVL3 (GOWN DISPOSABLE) ×2
HEMOSTAT POWDER KIT SURGIFOAM (HEMOSTASIS) ×1 IMPLANT
KIT BASIN OR (CUSTOM PROCEDURE TRAY) ×2 IMPLANT
KIT TURNOVER KIT B (KITS) ×2 IMPLANT
NDL HYPO 21X1.5 SAFETY (NEEDLE) IMPLANT
NEEDLE HYPO 21X1.5 SAFETY (NEEDLE) IMPLANT
NEEDLE HYPO 22GX1.5 SAFETY (NEEDLE) ×2 IMPLANT
NS IRRIG 1000ML POUR BTL (IV SOLUTION) ×2 IMPLANT
OIL CARTRIDGE MAESTRO DRILL (MISCELLANEOUS) ×2
PACK LAMINECTOMY NEURO (CUSTOM PROCEDURE TRAY) ×2 IMPLANT
PAD ARMBOARD 7.5X6 YLW CONV (MISCELLANEOUS) ×6 IMPLANT
PATTIES SURGICAL .5 X1 (DISPOSABLE) IMPLANT
SPONGE SURGIFOAM ABS GEL SZ50 (HEMOSTASIS) ×1 IMPLANT
STRIP CLOSURE SKIN 1/2X4 (GAUZE/BANDAGES/DRESSINGS) ×2 IMPLANT
SUT VIC AB 1 CT1 18XBRD ANBCTR (SUTURE) ×1 IMPLANT
SUT VIC AB 1 CT1 8-18 (SUTURE) ×2
SUT VIC AB 2-0 CP2 18 (SUTURE) ×2 IMPLANT
TOWEL GREEN STERILE (TOWEL DISPOSABLE) ×2 IMPLANT
TOWEL GREEN STERILE FF (TOWEL DISPOSABLE) ×2 IMPLANT
WATER STERILE IRR 1000ML POUR (IV SOLUTION) ×2 IMPLANT

## 2020-11-26 NOTE — Anesthesia Preprocedure Evaluation (Addendum)
Anesthesia Evaluation  Patient identified by MRN, date of birth, ID band Patient awake    Reviewed: Allergy & Precautions, NPO status , Patient's Chart, lab work & pertinent test results  Airway Mallampati: II  TM Distance: >3 FB     Dental   Pulmonary asthma , sleep apnea , Patient abstained from smoking., former smoker,    breath sounds clear to auscultation       Cardiovascular hypertension,  Rhythm:Regular Rate:Normal     Neuro/Psych Anxiety    GI/Hepatic Neg liver ROS, hiatal hernia, GERD  ,  Endo/Other  negative endocrine ROS  Renal/GU negative Renal ROS     Musculoskeletal   Abdominal   Peds  Hematology   Anesthesia Other Findings   Reproductive/Obstetrics                            Anesthesia Physical Anesthesia Plan  ASA: 3  Anesthesia Plan: General   Post-op Pain Management:    Induction: Intravenous  PONV Risk Score and Plan: 3 and Ondansetron, Dexamethasone, Midazolam and Treatment may vary due to age or medical condition  Airway Management Planned: Oral ETT  Additional Equipment:   Intra-op Plan:   Post-operative Plan: Extubation in OR  Informed Consent: I have reviewed the patients History and Physical, chart, labs and discussed the procedure including the risks, benefits and alternatives for the proposed anesthesia with the patient or authorized representative who has indicated his/her understanding and acceptance.     Dental advisory given  Plan Discussed with: CRNA and Anesthesiologist  Anesthesia Plan Comments:        Anesthesia Quick Evaluation

## 2020-11-26 NOTE — Transfer of Care (Signed)
Immediate Anesthesia Transfer of Care Note  Patient: Corey Henry  Procedure(s) Performed: MICRODISCECTOMY LUMBAR FIVE-SACRAL ONE (Right: Spine Lumbar)  Patient Location: PACU  Anesthesia Type:General  Level of Consciousness: awake, alert , oriented and patient cooperative  Airway & Oxygen Therapy: Patient Spontanous Breathing  Post-op Assessment: Report given to RN and Post -op Vital signs reviewed and stable  Post vital signs: Reviewed and stable  Last Vitals:  Vitals Value Taken Time  BP 137/95 11/26/20 1712  Temp 36.3 C 11/26/20 1712  Pulse 89 11/26/20 1713  Resp 10 11/26/20 1713  SpO2 97 % 11/26/20 1713  Vitals shown include unvalidated device data.  Last Pain:  Vitals:   11/26/20 1308  TempSrc:   PainSc: 8       Patients Stated Pain Goal: 4 (11/26/20 1308)  Complications: No notable events documented.

## 2020-11-26 NOTE — Progress Notes (Signed)
Patient arrived to short with an 8/10 pain in right buttocks radiating down back of thigh and into calf.  Anesthesia was made aware of pain and the patient's request for pain medicine.

## 2020-11-26 NOTE — Progress Notes (Signed)
Patient walked 2 laps around the unit and voided before discharge.

## 2020-11-26 NOTE — Op Note (Signed)
Brief history: The patient is a 25 year old white male on whom I previously performed a right L5-S1 discectomy.  He has done well until recently when he developed recurrent right back, buttock and leg pain consistent with a right S1 radiculopathy.  He failed medical management.  He was worked up with a lumbar MRI which demonstrated a recurrent herniated disc at L5-S1 on the right.  I discussed the various treatment options with him.  He has decided proceed with a redo right L5-S1 discectomy.  Preoperative diagnosis: Recurrent right L5-S1 herniated disc, lumbosacral radiculopathy, lumbago  Postoperative diagnosis: The same  Procedure: Redo right L5-S1 intervertebral discectomy using micro-dissection  Surgeon: Dr. Delma Officer  Asst.: Hildred Priest, NP  Anesthesia: Gen. endotracheal  Estimated blood loss: Minimal  Drains: None  Complications: None  Description of procedure: The patient was brought to the operating room by the anesthesia team. General endotracheal anesthesia was induced. The patient was turned to the prone position on the Wilson frame. The patient's lumbosacral region was then prepared with Betadine scrub and Betadine solution. Sterile drapes were applied.  I then injected the area to be incised with Marcaine with epinephrine solution. I then used a scalpel to make a linear midline incision over the L5-S1 intervertebral disc space. I then used electrocautery to perform a right sided subperiosteal dissection exposing the spinous process and lamina of L5 and S1. We obtained intraoperative radiograph to confirm our location. I then inserted the [facility] retractor for exposure.  We then brought the operative microscope into the field. Under its magnification and illumination we completed the microdissection. I used a high-speed drill to widen the patient's previous right L5-S1 laminotomy. I then used a Kerrison punches to remove the epidural scar tissue and to widen the laminotomy  and removed the manger of the ligamentum flavum at 5 S1 on the right. We then used microdissection to free up the thecal sac and the right S1 nerve root from the epidural scar tissue. I then used a Kerrison punch to perform a foraminotomy at about the S1 nerve root.  The assistant then using the nerve root retractor to gently retract the thecal sac and the S1 nerve root medially. This exposed the intervertebral disc herniation. We identified the ruptured disc and remove it with the pituitary forceps.  We performed a partial intervertebral discectomy with a pituitary forceps.  I then palpated along the ventral surface of the thecal sac and along exit route of the right S1 nerve root and noted that the neural structures were well decompressed. This completed the decompression.  We then obtained hemostasis using bipolar electrocautery. We irrigated the wound out with bacitracin solution. We then removed the retractor. We then reapproximated the patient's thoracolumbar fascia with interrupted #1 Vicryl suture. We then reapproximated the patient's subcutaneous tissue with interrupted 2-0 Vicryl suture. We then reapproximated patient's skin with Steri-Strips and benzoin. The was then coated with bacitracin ointment. The drapes were removed. The patient was subsequently returned to the supine position where they were extubated by the anesthesia team. The patient was then transported to the postanesthesia care unit in stable condition. All sponge instrument and needle counts were reportedly correct at the end of this case.

## 2020-11-26 NOTE — Anesthesia Procedure Notes (Signed)
Procedure Name: Intubation Date/Time: 11/26/2020 4:45 PM Performed by: Minerva Ends, CRNA Pre-anesthesia Checklist: Patient identified, Emergency Drugs available, Suction available and Patient being monitored Patient Re-evaluated:Patient Re-evaluated prior to induction Oxygen Delivery Method: Circle system utilized Preoxygenation: Pre-oxygenation with 100% oxygen Induction Type: IV induction Ventilation: Mask ventilation without difficulty Laryngoscope Size: Mac and 3 Grade View: Grade I Tube type: Oral Tube size: 7.5 mm Number of attempts: 1 Airway Equipment and Method: Stylet and Oral airway Placement Confirmation: ETT inserted through vocal cords under direct vision, positive ETCO2 and breath sounds checked- equal and bilateral Secured at: 23 cm Tube secured with: Tape Dental Injury: Teeth and Oropharynx as per pre-operative assessment

## 2020-11-26 NOTE — H&P (Signed)
Subjective: The patient is a 25 year old white male on whom I performed a lumbar discectomy.  He initially did well but has developed recurrent back and right leg pain consistent with a right S1 radiculopathy.  He has failed medical management.  He was worked up with a lumbar MRI which demonstrated recurrent herniated disc at L5-S1 on the right.  I discussed the various treatment options with him.  He has decided proceed with surgery.  Past Medical History:  Diagnosis Date   Anxiety    Diverticulosis    Exercise-induced asthma    as a child   Family history of adverse reaction to anesthesia    Doneta Public father- "allergic to an anesthesia medication, he was  hard to awaken" "Had to give him medication to reverse medication."   GERD (gastroesophageal reflux disease)    Heart murmur    Hemorrhoids    History of hiatal hernia    Hypertension    no longer takes medication   MVP (mitral valve prolapse)    Pre-diabetes    Sleep apnea    Vitamin D deficiency     Past Surgical History:  Procedure Laterality Date   APPENDECTOMY     COLONOSCOPY W/ BIOPSIES     ESOPHAGOGASTRODUODENOSCOPY     LUMBAR MICRODISCECTOMY     MEATOTOMY     TONSILLECTOMY AND ADENOIDECTOMY     TYMPANOSTOMY TUBE PLACEMENT      Allergies  Allergen Reactions   Azithromycin Other (See Comments)    Patient stated that he has sore throat. Mouth sores   Erythromycin     Unknown reaction   Morphine Hives    Social History   Tobacco Use   Smoking status: Former    Types: Cigarettes    Quit date: 08/28/2016    Years since quitting: 4.2   Smokeless tobacco: Former    Types: Snuff    Quit date: 2020   Tobacco comments:    still currently vaping  Substance Use Topics   Alcohol use: Not Currently    Family History  Problem Relation Age of Onset   Cancer Other    Diabetes Other    Diabetes Father    Colon cancer Maternal Grandmother    Lung cancer Maternal Grandfather    Mesothelioma Paternal Grandfather     Diverticulitis Paternal Uncle    Stomach cancer Neg Hx    Pancreatic cancer Neg Hx    Esophageal cancer Neg Hx    Inflammatory bowel disease Neg Hx    Liver disease Neg Hx    Prior to Admission medications   Medication Sig Start Date End Date Taking? Authorizing Provider  Cholecalciferol (VITAMIN D) 50 MCG (2000 UT) tablet Take 4,000 Units by mouth daily.   Yes [provider]  clonazePAM (KLONOPIN) 0.5 MG tablet Take 0.5 mg by mouth 2 (two) times daily. 09/08/17 11/26/20 Yes [provider]  EPINEPHrine 0.3 mg/0.3 mL IJ SOAJ injection Inject 0.3 mg into the muscle as needed for anaphylaxis.   Yes [provider]  famotidine (PEPCID) 40 MG tablet Take 1 tablet (40 mg total) by mouth daily. Patient taking differently: Take 40 mg by mouth daily as needed for heartburn. 01/13/20 11/26/20 Yes Mansouraty, Netty Starring., MD  gabapentin (NEURONTIN) 300 MG capsule Take 300 mg by mouth 3 (three) times daily. 11/17/20  Yes [provider]  meloxicam (MOBIC) 7.5 MG tablet Take 7.5 mg by mouth daily.   Yes [provider]  oxyCODONE-acetaminophen (PERCOCET/ROXICET) 5-325 MG tablet Take  1 tablet by mouth every 4 (four) hours as needed for severe pain.   Yes [provider]  polycarbophil (FIBERCON) 625 MG tablet Take 1,250 mg by mouth daily.   Yes [provider]     Review of Systems  Positive ROS: As above  All other systems have been reviewed and were otherwise negative with the exception of those mentioned in the HPI and as above.  Objective: Vital signs in last 24 hours: Temp:  [98.1 F (36.7 C)] 98.1 F (36.7 C) (09/12 1254) Pulse Rate:  [72] 72 (09/12 1254) Resp:  [18] 18 (09/12 1254) BP: (140)/(95) 140/95 (09/12 1254) SpO2:  [98 %] 98 % (09/12 1254) Weight:  [106.6 kg] 106.6 kg (09/12 1254) Estimated body mass index is 30.17 kg/m as calculated from the following:   Height as of this encounter: 6\' 2"  (1.88 m).   Weight as of  this encounter: 106.6 kg.   General Appearance: Alert Head: Normocephalic, without obvious abnormality, atraumatic Eyes: PERRL, conjunctiva/corneas clear, EOM's intact,    Ears: Normal  Throat: Normal  Neck: Supple, Back: His lumbar incision is well-healed.  He has a positive right straight leg raise test Lungs: Clear to auscultation bilaterally, respirations unlabored Heart: Regular rate and rhythm, no murmur, rub or gallop Abdomen: Soft, non-tender Extremities: Extremities normal, atraumatic, no cyanosis or edema Skin: unremarkable  NEUROLOGIC:   Mental status: alert and oriented,Motor Exam - grossly normal Sensory Exam -numbness in the right S1 distribution Reflexes: His right Achilles reflex is absent Coordination - grossly normal Gait - grossly normal Balance - grossly normal Cranial Nerves: I: smell Not tested  II: visual acuity  OS: Normal  OD: Normal   II: visual fields Full to confrontation  II: pupils Equal, round, reactive to light  III,VII: ptosis None  III,IV,VI: extraocular muscles  Full ROM  V: mastication Normal  V: facial light touch sensation  Normal  V,VII: corneal reflex  Present  VII: facial muscle function - upper  Normal  VII: facial muscle function - lower Normal  VIII: hearing Not tested  IX: soft palate elevation  Normal  IX,X: gag reflex Present  XI: trapezius strength  5/5  XI: sternocleidomastoid strength 5/5  XI: neck flexion strength  5/5  XII: tongue strength  Normal    Data Review Lab Results  Component Value Date   WBC 6.7 11/26/2020   HGB 15.1 11/26/2020   HCT 43.4 11/26/2020   MCV 85.3 11/26/2020   PLT 392 11/26/2020   Lab Results  Component Value Date   NA 136 10/25/2020   K 3.5 10/25/2020   CL 101 10/25/2020   CO2 28 10/25/2020   BUN 14 10/25/2020   CREATININE 0.89 10/25/2020   GLUCOSE 161 (H) 10/25/2020   Lab Results  Component Value Date   INR 1.2 (H) 12/23/2019    Assessment/Plan: Recurrent right L5-S1  herniated disc, lumbosacral radiculopathy, lumbago: I have discussed the situation with the patient.  I reviewed his imaging studies with him and pointed out the abnormalities.  We have discussed the various treatment options including surgery.  I have described the surgical treatment option of a redo right L5-S1 discectomy.  I have shown him surgical models.  I have given him a surgical pamphlet.  We have discussed the risk, benefits, alternatives, expected postop course, and likelihood of achieving our goals with surgery.  I have answered his questions.  He wants to proceed with surgery.   02/22/2020 11/26/2020 2:48 PM

## 2020-11-26 NOTE — Op Note (Signed)
Physician Discharge Summary  Patient ID: Corey Henry MRN: [id number] DOB/AGE: [birth year] 25 y.o.  Admit date: 11/26/2020 Discharge date: 11/26/2020  Admission Diagnoses: Recurrent right L5-S1 herniated disc, lumbosacral radiculopathy, lumbago  Discharge Diagnoses: The same Active Problems:   * No active hospital problems. *   Discharged Condition: good  Hospital Course: I performed a redo right L5-S1 discectomy on the patient on 11/26/2020.  The surgery went well.  The patient's postoperative course was unremarkable.  He requested discharge home.  He was given verbal and written discharge instructions.  All his questions were answered.  Consults: None Significant Diagnostic Studies: None Treatments: Redo right L5-S1 discectomy using microdissection Discharge Exam: Blood pressure (!) 140/95, pulse 72, temperature 98.1 F (36.7 C), temperature source Oral, resp. rate 18, height 6\' 2"  (1.88 m), weight 106.6 kg, SpO2 98 %. The patient is alert and pleasant.  His strength is normal.  Disposition: Home  Discharge Instructions      Remove dressing in 72 hours   Complete by: As directed    Call MD for:  difficulty breathing, headache or visual disturbances   Complete by: As directed    Call MD for:  extreme fatigue   Complete by: As directed    Call MD for:  hives   Complete by: As directed    Call MD for:  persistant dizziness or light-headedness   Complete by: As directed    Call MD for:  persistant nausea and vomiting   Complete by: As directed    Call MD for:  redness, tenderness, or signs of infection (pain, swelling, redness, odor or green/yellow discharge around incision site)   Complete by: As directed    Call MD for:  severe uncontrolled pain   Complete by: As directed    Call MD for:  temperature >100.4   Complete by: As directed    Diet - low sodium heart healthy   Complete by: As directed    Discharge instructions   Complete by: As directed    Call  [phone] for a followup appointment. Take a stool softener while you are using pain medications.   Driving Restrictions   Complete by: As directed    Do not drive for 2 weeks.   Increase activity slowly   Complete by: As directed    Lifting restrictions   Complete by: As directed    Do not lift more than 5 pounds. No excessive bending or twisting.   May shower / Bathe   Complete by: As directed    Remove the dressing for 3 days after surgery.  You may shower, but leave the incision alone.      Allergies as of 11/26/2020       Reactions   Azithromycin Other (See Comments)   Patient stated that he has sore throat. Mouth sores   Erythromycin    Unknown reaction   Morphine Hives        Medication List     TAKE these medications    clonazePAM 0.5 MG tablet Commonly known as: KLONOPIN Take 0.5 mg by mouth 2 (two) times daily.   EPINEPHrine 0.3 mg/0.3 mL Soaj injection Commonly known as: EPI-PEN Inject 0.3 mg into the muscle as needed for anaphylaxis.   famotidine 40 MG tablet Commonly known as: PEPCID Take 1 tablet (40 mg total) by mouth daily. What changed:  when to take this reasons to take this   FiberCon 625 MG tablet Generic drug: polycarbophil Take 1,250 mg by mouth  daily.   gabapentin 300 MG capsule Commonly known as: NEURONTIN Take 300 mg by mouth 3 (three) times daily.   meloxicam 7.5 MG tablet Commonly known as: MOBIC Take 7.5 mg by mouth daily.   oxyCODONE-acetaminophen 5-325 MG tablet Commonly known as: PERCOCET/ROXICET Take 1-2 tablets by mouth every 4 (four) hours as needed for moderate pain. What changed:  how much to take reasons to take this   Vitamin D 50 MCG (2000 UT) tablet Take 4,000 Units by mouth daily.         Signed: Cristi Loron 11/26/2020, 5:11 PM

## 2020-11-27 ENCOUNTER — Encounter (HOSPITAL_COMMUNITY): Payer: Self-pay | Admitting: Neurosurgery

## 2020-11-27 NOTE — Anesthesia Postprocedure Evaluation (Signed)
Anesthesia Post Note  Patient: Corey Henry  Procedure(s) Performed: MICRODISCECTOMY LUMBAR FIVE-SACRAL ONE (Right: Spine Lumbar)     Patient location during evaluation: PACU Anesthesia Type: General Level of consciousness: awake Pain management: pain level controlled Respiratory status: spontaneous breathing Cardiovascular status: stable Anesthetic complications: no   No notable events documented.  Last Vitals:  Vitals:   11/26/20 1727 11/26/20 1745  BP: 130/80 133/86  Pulse: 68 71  Resp: 11 15  Temp:  36.5 C  SpO2: 95% 98%    Last Pain:  Vitals:   11/26/20 1750  TempSrc:   PainSc: 4                  Abbrielle Batts

## 2021-08-17 ENCOUNTER — Other Ambulatory Visit: Payer: Self-pay | Admitting: Family Medicine

## 2021-08-17 ENCOUNTER — Other Ambulatory Visit (HOSPITAL_COMMUNITY): Payer: Self-pay | Admitting: Family Medicine

## 2021-08-17 DIAGNOSIS — M545 Low back pain, unspecified: Secondary | ICD-10-CM

## 2021-08-23 ENCOUNTER — Ambulatory Visit (HOSPITAL_BASED_OUTPATIENT_CLINIC_OR_DEPARTMENT_OTHER)
Admission: RE | Admit: 2021-08-23 | Discharge: 2021-08-23 | Disposition: A | Discharge status: Home / Self Care | Payer: Federal, State, Local not specified - PPO | Source: Ambulatory Visit | Attending: Family Medicine | Admitting: Family Medicine

## 2021-08-23 DIAGNOSIS — M545 Low back pain, unspecified: Secondary | ICD-10-CM | POA: Insufficient documentation

## 2021-10-10 ENCOUNTER — Telehealth: Payer: Self-pay | Admitting: Pulmonary Disease

## 2021-10-10 DIAGNOSIS — G4733 Obstructive sleep apnea (adult) (pediatric): Secondary | ICD-10-CM

## 2021-10-11 NOTE — Telephone Encounter (Signed)
Called and left voicemail for patient to call office back in regards to cpap supplies

## 2021-10-18 NOTE — Telephone Encounter (Signed)
Called patient back and he states that he is needing supplies for his cpap sent in. He last saw Dr Val Eagle in 06/2020. His next office visit is 12/04/2021 with Dr Val Eagle. He states that he has not received any supplies at all for a year and his current mask is old and not good anymore. Sent in new order for supplies. But I did tell patient he has to come to [facility] in September. Nothing further needed

## 2021-12-04 ENCOUNTER — Ambulatory Visit: Payer: Federal, State, Local not specified - PPO | Admitting: Pulmonary Disease

## 2022-05-10 ENCOUNTER — Emergency Department (HOSPITAL_COMMUNITY): Payer: Federal, State, Local not specified - PPO

## 2022-05-10 ENCOUNTER — Emergency Department (HOSPITAL_COMMUNITY)
Admission: EM | Admit: 2022-05-10 | Discharge: 2022-05-10 | Disposition: A | Discharge status: Home / Self Care | Payer: Federal, State, Local not specified - PPO | Attending: Emergency Medicine | Admitting: Emergency Medicine

## 2022-05-10 ENCOUNTER — Encounter (HOSPITAL_COMMUNITY): Payer: Self-pay | Admitting: *Deleted

## 2022-05-10 ENCOUNTER — Other Ambulatory Visit: Payer: Self-pay

## 2022-05-10 DIAGNOSIS — R109 Unspecified abdominal pain: Secondary | ICD-10-CM

## 2022-05-10 DIAGNOSIS — R350 Frequency of micturition: Secondary | ICD-10-CM | POA: Diagnosis not present

## 2022-05-10 DIAGNOSIS — R3915 Urgency of urination: Secondary | ICD-10-CM | POA: Insufficient documentation

## 2022-05-10 DIAGNOSIS — Z1152 Encounter for screening for COVID-19: Secondary | ICD-10-CM | POA: Diagnosis not present

## 2022-05-10 DIAGNOSIS — R509 Fever, unspecified: Secondary | ICD-10-CM | POA: Insufficient documentation

## 2022-05-10 LAB — URINALYSIS, ROUTINE W REFLEX MICROSCOPIC
Bacteria, UA: NONE SEEN
Bilirubin Urine: NEGATIVE
Glucose, UA: NEGATIVE mg/dL
Ketones, ur: NEGATIVE mg/dL
Leukocytes,Ua: NEGATIVE
Nitrite: NEGATIVE
Protein, ur: NEGATIVE mg/dL
Specific Gravity, Urine: 1.012 (ref 1.005–1.030)
pH: 7 (ref 5.0–8.0)

## 2022-05-10 LAB — BASIC METABOLIC PANEL
Anion gap: 9 (ref 5–15)
BUN: 10 mg/dL (ref 6–20)
CO2: 26 mmol/L (ref 22–32)
Calcium: 9.2 mg/dL (ref 8.9–10.3)
Chloride: 97 mmol/L — ABNORMAL LOW (ref 98–111)
Creatinine, Ser: 0.97 mg/dL (ref 0.61–1.24)
GFR, Estimated: >60 mL/min (ref >60)
Glucose, Bld: 122 mg/dL — ABNORMAL HIGH (ref 70–99)
Potassium: 3.4 mmol/L — ABNORMAL LOW (ref 3.5–5.1)
Sodium: 132 mmol/L — ABNORMAL LOW (ref 135–145)

## 2022-05-10 LAB — CBC WITH DIFFERENTIAL/PLATELET
Abs Immature Granulocytes: 0.04 10*3/uL (ref 0.00–0.07)
Basophils Absolute: 0 10*3/uL (ref 0.0–0.1)
Basophils Relative: 0 %
Eosinophils Absolute: 0 10*3/uL (ref 0.0–0.5)
Eosinophils Relative: 0 %
HCT: 41.3 % (ref 39.0–52.0)
Hemoglobin: 14.1 g/dL (ref 13.0–17.0)
Immature Granulocytes: 0 %
Lymphocytes Relative: 18 %
Lymphs Abs: 2 10*3/uL (ref 0.7–4.0)
MCH: 29.3 pg (ref 26.0–34.0)
MCHC: 34.1 g/dL (ref 30.0–36.0)
MCV: 85.7 fL (ref 80.0–100.0)
Monocytes Absolute: 0.9 10*3/uL (ref 0.1–1.0)
Monocytes Relative: 8 %
Neutro Abs: 7.9 10*3/uL — ABNORMAL HIGH (ref 1.7–7.7)
Neutrophils Relative %: 74 %
Platelets: 326 10*3/uL (ref 150–400)
RBC: 4.82 MIL/uL (ref 4.22–5.81)
RDW: 12.2 % (ref 11.5–15.5)
WBC: 10.9 10*3/uL — ABNORMAL HIGH (ref 4.0–10.5)
nRBC: 0 % (ref 0.0–0.2)

## 2022-05-10 LAB — RESP PANEL BY RT-PCR (RSV, FLU A&B, COVID)  RVPGX2
Influenza A by PCR: NEGATIVE
Influenza B by PCR: NEGATIVE
Resp Syncytial Virus by PCR: NEGATIVE
SARS Coronavirus 2 by RT PCR: NEGATIVE

## 2022-05-10 MED ORDER — SODIUM CHLORIDE 0.9 % IV BOLUS
1000.0000 mL | Freq: Once | INTRAVENOUS | Status: AC
  Administered 2022-05-10: 1000 mL via INTRAVENOUS

## 2022-05-10 MED ORDER — IOHEXOL 300 MG/ML  SOLN
100.0000 mL | Freq: Once | INTRAMUSCULAR | Status: AC | PRN
  Administered 2022-05-10: 100 mL via INTRAVENOUS

## 2022-05-10 MED ORDER — KETOROLAC TROMETHAMINE 15 MG/ML IJ SOLN
15.0000 mg | Freq: Once | INTRAMUSCULAR | Status: AC
  Administered 2022-05-10: 15 mg via INTRAVENOUS
  Filled 2022-05-10: qty 1

## 2022-05-10 NOTE — ED Provider Notes (Signed)
[facility] Provider Note   CSN: [id number] Arrival date & time: 05/10/22  1333     History  Chief Complaint  Patient presents with   Flank Pain    Corey Henry is a 27 y.o. male, no pertinent past medical history, who presents to the ED secondary to bilateral flank pain, fever, urinary frequency, urgency for the last 4 days.  He states states that he initially thought he had a urinary tract infection because he had some left flank pain, and it was a little bit painful to urinate, and he had increased frequency, went to see his primary care doctor and was started back on Macrobid, but did not have relief of his symptoms.  States that his back pain started going to his right, couple days ago, and now last night he had a fever of 100.4.  He states he feels bad and, just achy all over.  And has not tried anything for his pain.  Denies any nausea or vomiting at this time.  Denies any shortness of breath or cough.  No penile discharge, or testicular pain.    Home Medications Prior to Admission medications   Medication Sig Start Date End Date Taking? Authorizing Provider  Cholecalciferol (VITAMIN D) 50 MCG (2000 UT) tablet Take 2,000 Units by mouth daily.   Yes [provider]  clonazePAM (KLONOPIN) 0.5 MG tablet Take 0.5 mg by mouth 2 (two) times daily. 09/08/17 05/10/22 Yes [provider]  EPINEPHrine 0.3 mg/0.3 mL IJ SOAJ injection Inject 0.3 mg into the muscle as needed for anaphylaxis.   Yes [provider]  famotidine (PEPCID) 40 MG tablet Take 1 tablet (40 mg total) by mouth daily. Patient taking differently: Take 40 mg by mouth daily as needed for heartburn. 01/13/20 05/10/22 Yes Mansouraty, Telford Nab., MD  ibuprofen (ADVIL) 600 MG tablet Take 1 tablet by mouth 3 (three) times daily as needed. 08/02/21  Yes [provider]  MACROBID 100 MG capsule Take 100 mg by mouth 2 (two) times daily. 05/08/22  Yes  [provider]  polycarbophil (FIBERCON) 625 MG tablet Take 1,250 mg by mouth daily.   Yes [provider]  propranolol (INDERAL) 10 MG tablet Take 1 tablet by mouth 3 (three) times daily as needed.   Yes [provider]      Allergies    Azithromycin, Erythromycin, and Morphine    Review of Systems   Review of Systems  Constitutional:  Positive for fever.  Gastrointestinal:  Negative for nausea and vomiting.  Genitourinary:  Positive for flank pain, frequency and urgency.    Physical Exam Updated Vital Signs BP (!) 143/93 (BP Location: Right Arm)   Pulse 78   Temp 98.6 F (37 C) (Oral)   Resp 18   Ht '6\' 2"'$  (1.88 m)   Wt 106.6 kg   SpO2 99%   BMI 30.17 kg/m  Physical Exam Vitals and nursing note reviewed.  Constitutional:      General: He is not in acute distress.    Appearance: He is well-developed.  HENT:     Head: Normocephalic and atraumatic.  Eyes:     Conjunctiva/sclera: Conjunctivae normal.  Cardiovascular:     Rate and Rhythm: Normal rate and regular rhythm.     Heart sounds: No murmur heard. Pulmonary:     Effort: Pulmonary effort is normal. No respiratory distress.     Breath sounds: Normal breath sounds.  Abdominal:  Palpations: Abdomen is soft.     Tenderness: There is abdominal tenderness. There is right CVA tenderness and left CVA tenderness.  Musculoskeletal:        General: No swelling.     Cervical back: Neck supple.  Skin:    General: Skin is warm and dry.     Capillary Refill: Capillary refill takes less than 2 seconds.  Neurological:     Mental Status: He is alert.  Psychiatric:        Mood and Affect: Mood normal.     ED Results / Procedures / Treatments   Labs (all labs ordered are listed, but only abnormal results are displayed) Labs Reviewed  BASIC METABOLIC PANEL - Abnormal; Notable for the following components:      Result Value   Sodium 132 (*)    Potassium 3.4 (*)    Chloride 97 (*)     Glucose, Bld 122 (*)    All other components within normal limits  CBC WITH DIFFERENTIAL/PLATELET - Abnormal; Notable for the following components:   WBC 10.9 (*)    Neutro Abs 7.9 (*)    All other components within normal limits  URINALYSIS, ROUTINE W REFLEX MICROSCOPIC - Abnormal; Notable for the following components:   Hgb urine dipstick [name] (*)    All other components within normal limits  RESP PANEL BY RT-PCR (RSV, FLU A&B, COVID)  [id number]    EKG None  Radiology CT ABDOMEN PELVIS W CONTRAST  Result Date: 05/10/2022 CLINICAL DATA:  LLQ pain EXAM: CT ABDOMEN AND PELVIS WITH CONTRAST TECHNIQUE: Multidetector CT imaging of the abdomen and pelvis was performed using the standard protocol following bolus administration of intravenous contrast. RADIATION DOSE REDUCTION: This exam was performed according to the departmental dose-optimization program which includes automated exposure control, adjustment of the mA and/or kV according to patient size and/or use of iterative reconstruction technique. CONTRAST:  100 mL OMNIPAQUE IOHEXOL 300 MG/ML  SOLN COMPARISON:  10/25/2020 FINDINGS: Lower chest: No acute abnormality. Hepatobiliary: No focal liver abnormality is seen. No gallstones, gallbladder wall thickening, or biliary dilatation. Pancreas: Unremarkable. No pancreatic ductal dilatation or surrounding inflammatory changes. Spleen: Normal in size without focal abnormality. Adrenals/Urinary Tract: Adrenal glands are unremarkable. Kidneys are normal, without renal calculi, focal lesion, or hydronephrosis. Bladder is unremarkable. Stomach/Bowel: Stomach is within normal limits. Appendix is not identified and there is no evidence of appendicitis. No evidence of bowel wall thickening, distention, or inflammatory changes. Vascular/Lymphatic: No significant vascular findings are present. No enlarged abdominal or pelvic lymph nodes. Reproductive: Prostate is unremarkable. Other: No abdominal wall hernia or  abnormality. No abdominopelvic ascites. Musculoskeletal: No acute or significant osseous findings. IMPRESSION: Unremarkable examination of the abdomen and pelvis. Electronically Signed   By: Sammie Bench M.D.   On: 05/10/2022 16:06    Procedures Procedures   Medications Ordered in ED Medications  ketorolac (TORADOL) 15 MG/ML injection 15 mg (15 mg Intravenous Given 05/10/22 1513)  sodium chloride 0.9 % bolus 1,000 mL (0 mLs Intravenous Stopped 05/10/22 1710)  iohexol (OMNIPAQUE) 300 MG/ML solution 100 mL (100 mLs Intravenous Contrast Given 05/10/22 1542)    ED Course/ Medical Decision Making/ A&P   {                           Medical Decision Making Patient is a 27 year old male, no pertinent past medical history, who presents to the ED secondary to bilateral flank pain and a fever.  States  that fever started last night, flank pain started a couple days ago and was left-sided, and has started Macrobid bid.  Endorses urinary frequency and urgency.  Denies any dysuria, penile discharge, testicular pain, concern for STDs.  We will obtain a CT abdomen pelvis given his flank pain, and fever, as well as urinalysis CBC, CMP.  Give Toradol and IV fluids for pain control.  Amount and/or Complexity of Data Reviewed Labs: ordered.    Details: Unremarkable Radiology: ordered.    Details: CT abdomen pelvis shows no acute findings Discussion of management or test interpretation with external provider(s): Discussed with patient, CT and pelvis unremarkable, obtained COVID/flu testing secondary to patient's fever of unknown origin.  He is well-appearing, lab work is unremarkable CT abdomen pelvis shows no acute findings.  Is possible that his bilateral flank pain is secondary to muscle strain, and that he is developing a viral illness.  At the end of the visit patient states that he has nasal congestion, sore throat, which makes this sound like it is a febrile illness secondary to respiratory illness.  His  lungs were clear to auscultation.  We discussed follow-up primary care doctor, return precautions.  Risk Prescription drug management.    Final Clinical Impression(s) / ED Diagnoses Final diagnoses:  Left flank pain  Fever, unspecified fever cause    Rx / DC Orders ED Discharge Orders     None         Rosielee Corporan, Si Gaul, PA 05/10/22 1736    Milton Ferguson, MD 05/13/22 1528

## 2022-05-10 NOTE — ED Triage Notes (Signed)
Here by [facility] from home for bilateral flank pain. Sx onset Wednesday. Started on L, then moved to R. Seen by PCP for the same. Urine showed hematuria, thought was UTI. Urine and culture sent. Started on macrobid, and still taking, but culture came back normal. Now not sure if kidney stone. Endorses pain and nausea. Last night developed fever and chills. Denies vomiting.

## 2022-05-10 NOTE — ED Notes (Signed)
Patient given a urine strainer. Nurse aware.

## 2022-05-10 NOTE — Discharge Instructions (Addendum)
Your CT scan and your lab work was good.  There are no signs of an infection.  Please follow-up with your primary care doctor.  The etiology of your flank pain and fever is unknown.  Your flank pain may be secondary to muscle strain, and your fever may be secondary to a developing illness as you stated that you had now started developing nasal congestion.  Return to the ER if you become confused, have intractable nausea or vomiting, or have worsening has severe flank pain.

## 2022-11-14 ENCOUNTER — Ambulatory Visit: Payer: Federal, State, Local not specified - PPO | Admitting: Allergy & Immunology

## 2022-11-14 ENCOUNTER — Encounter: Payer: Self-pay | Admitting: Allergy & Immunology

## 2022-11-14 VITALS — BP 126/84 | HR 97 | Temp 98.0°F | Resp 16 | Ht 73.0 in | Wt 226.1 lb

## 2022-11-14 DIAGNOSIS — J4520 Mild intermittent asthma, uncomplicated: Secondary | ICD-10-CM

## 2022-11-14 DIAGNOSIS — K219 Gastro-esophageal reflux disease without esophagitis: Secondary | ICD-10-CM

## 2022-11-14 DIAGNOSIS — J302 Other seasonal allergic rhinitis: Secondary | ICD-10-CM | POA: Diagnosis not present

## 2022-11-14 DIAGNOSIS — J3089 Other allergic rhinitis: Secondary | ICD-10-CM

## 2022-11-14 DIAGNOSIS — E739 Lactose intolerance, unspecified: Secondary | ICD-10-CM | POA: Diagnosis not present

## 2022-11-14 MED ORDER — RYALTRIS 665-25 MCG/ACT NA SUSP: 2.0000 | Freq: Two times a day (BID) | NASAL | 5 refills | Status: AC

## 2022-11-14 MED ORDER — LEVOCETIRIZINE DIHYDROCHLORIDE 5 MG PO TABS: 5.0000 mg | ORAL_TABLET | Freq: Every evening | ORAL | 5 refills | Status: AC

## 2022-11-14 NOTE — Patient Instructions (Addendum)
1. Seasonal and perennial allergic rhinitis - Testing today showed: ragweed, weeds, indoor molds, outdoor molds, dust mites, and cockroach - Copy of test results provided.  - Avoidance measures provided. - Stop taking: current medications - Start taking: Xyzal (levocetirizine) 5mg  tablet once daily and Ryaltris (olopatadine/mometasone) two sprays per nostril 1-2 times daily as needed - Samples of Ryaltris provided.  - You can use an extra dose of the antihistamine, if needed, for breakthrough symptoms.  - Consider nasal saline rinses 1-2 times daily to remove allergens from the nasal cavities as well as help with mucous clearance (this is especially helpful to do before the nasal sprays are given) - Consider allergy shots as a means of long-term control. - Allergy shots "re-train" and "reset" the immune system to ignore environmental allergens and decrease the resulting immune response to those allergens (sneezing, itchy watery eyes, runny nose, nasal congestion, etc).    - Allergy shots improve symptoms in 75-85% of patients.  - We can discuss more at the next appointment if the medications are not working for you. - Information on allergy shots provided.   2. Mild intermittent asthma, uncomplicated - Lung testing looked great. - I do not think that you have any asthma right now. - There is no need for an inhaler at this point in time.   3. Lactose intolerance - Testing was negative to milk and casein. - Symptoms are consistent with lactose intolerance.   4. Gastroesophageal reflux disease - Continue with famotidine 40mg  daily as needed. - I do not think that you need any further treatment.   5. Return in about 2 months (around 01/14/2023). You can have the follow up appointment with Dr. Dellis Anes or a Nurse Practicioner (our Nurse Practitioners are excellent and always have Physician oversight!).    Please inform us of any Emergency Department visits, hospitalizations, or changes in  symptoms. Call us before going to the ED for breathing or allergy symptoms since we might be able to fit you in for a sick visit. Feel free to contact us anytime with any questions, problems, or concerns.  It was a pleasure to meet you today!  Websites that have reliable patient information: 1. American Academy of Asthma, Allergy, and Immunology: www.aaaai.org 2. Food Allergy Research and Education (FARE): foodallergy.org 3. Mothers of Asthmatics: http://www.asthmacommunitynetwork.org 4. American College of Allergy, Asthma, and Immunology: www.acaai.org   COVID-19 Vaccine Information can be found at: [email or web address] For questions related to vaccine distribution or appointments, please email [email or web address] or call [phone].   We realize that you might be concerned about having an allergic reaction to the COVID19 vaccines. To help with that concern, WE ARE OFFERING THE COVID19 VACCINES IN OUR OFFICE! Ask the front desk for dates!     "Like" [place] on Facebook and Instagram for our latest updates!      A healthy democracy works best when [employer] participate! Make sure you are registered to vote! If you have moved or changed any of your contact information, you will need to get this updated before voting! Scan the QR codes below to learn more!         Airborne Adult Perc - 11/14/22 1114     Time Antigen Placed 1114    Allergen Manufacturer Waynette Buttery    Location Back    Number of Test 55    1. Control-Buffer 50% Glycerol Negative    2. Control-Histamine 3+    3. Bahia Negative    4. [place] Negative  5. Johnson Negative    6. Kentucky Blue Negative    7. Meadow Fescue Negative    8. Perennial Rye Negative    9. Timothy Negative    10. Ragweed Mix Negative    11. Cocklebur Negative    12. Plantain,  English Negative    13. Baccharis Negative    14. Dog Fennel Negative    15. Russian Thistle Negative     16. Lamb's Quarters Negative    17. Sheep Sorrell Negative    18. Rough Pigweed Negative    19. Marsh Elder, Rough Negative    20. Mugwort, Common Negative    21. Box, Elder Negative    22. Cedar, red Negative    23. Sweet Gum Negative    24. Pecan Pollen Negative    25. Pine Mix Negative    26. Walnut, Black Pollen Negative    27. Red Mulberry Negative    28. Ash Mix Negative    29. Birch Mix Negative    30. Beech American Negative    31. Cottonwood, [place] Negative    32. Hickory, White Negative    33. Maple Mix Negative    34. Oak, [place] Mix Negative    35. Sycamore Eastern Negative    36. Alternaria Alternata Negative    37. Cladosporium Herbarum Negative    38. Aspergillus Mix Negative    39. Penicillium Mix Negative    40. Bipolaris Sorokiniana (Helminthosporium) Negative    41. Drechslera Spicifera (Curvularia) Negative    42. Mucor Plumbeus Negative    43. Fusarium Moniliforme Negative    44. Aureobasidium Pullulans (pullulara) Negative    45. Rhizopus Oryzae Negative    46. Botrytis Cinera Negative    47. Epicoccum Nigrum Negative    48. Phoma Betae Negative    49. Dust Mite Mix Negative    50. Cat Hair 10,000 BAU/ml Negative    51.  Dog Epithelia Negative    52. Mixed Feathers Negative    53. Horse Epithelia Negative    54. Cockroach, German Negative    55. Tobacco Leaf Negative    Comments Highly dermatographic             Intradermal - 11/14/22 1137     Time Antigen Placed 1145    Allergen Manufacturer Greer    Location Arm    Number of Test 16    Intradermal Select    Control Negative    Bahia Negative    French Southern Territories Negative    Johnson Negative    7 Grass Negative    Ragweed Mix 2+    Weed Mix 2+    Tree Mix Negative    Mold 1 3+    Mold 2 4+    Mold 3 3+    Mold 4 4+    Mite Mix 2+    Cat Negative    Dog Negative    Cockroach 3+    Other Omitted             Food Adult Perc - 11/14/22 1100     Time Antigen Placed 1114     Allergen Manufacturer Greer    Location Back    Number of allergen test 2    5. Milk, Cow Negative    6. Casein Negative             Reducing Pollen Exposure  The American Academy of Allergy, Asthma and Immunology suggests the following steps to reduce your exposure  to pollen during allergy seasons.    Do not hang sheets or clothing out to dry; pollen may collect on these items. Do not mow lawns or spend time around freshly cut grass; mowing stirs up pollen. Keep windows closed at night.  Keep car windows closed while driving. Minimize morning activities outdoors, a time when pollen counts are usually at their highest. Stay indoors as much as possible when pollen counts or humidity is high and on windy days when pollen tends to remain in the air longer. Use air conditioning when possible.  Many air conditioners have filters that trap the pollen spores. Use a HEPA room air filter to remove pollen form the indoor air you breathe.  Control of Mold Allergen   Mold and fungi can grow on a variety of surfaces provided certain temperature and moisture conditions exist.  Outdoor molds grow on plants, decaying vegetation and soil.  The major outdoor mold, Alternaria and Cladosporium, are found in very high numbers during hot and dry conditions.  Generally, a late Summer - Fall peak is seen for common outdoor fungal spores.  Rain will temporarily lower outdoor mold spore count, but counts rise rapidly when the rainy period ends.  The most important indoor molds are Aspergillus and Penicillium.  Dark, humid and poorly ventilated basements are ideal sites for mold growth.  The next most common sites of mold growth are the bathroom and the kitchen.  Outdoor (Seasonal) Mold Control  Positive outdoor molds via skin testing: Alternaria, Cladosporium, Bipolaris (Helminthsporium), Drechslera (Curvalaria), and Mucor  Use air conditioning and keep windows closed Avoid exposure to decaying  vegetation. Avoid leaf raking. Avoid grain handling. Consider wearing a face mask if working in moldy areas.    Indoor (Perennial) Mold Control   Positive indoor molds via skin testing: Aspergillus, Penicillium, Fusarium, Aureobasidium (Pullulara), and Rhizopus  Maintain humidity below 50%. Clean washable surfaces with 5% bleach solution. Remove sources e.g. contaminated carpets.    Control of Dust Mite Allergen    Dust mites play a major role in allergic asthma and rhinitis.  They occur in environments with high humidity wherever human skin is found.  Dust mites absorb humidity from the atmosphere (ie, they do not drink) and feed on organic matter (including shed human and animal skin).  Dust mites are a microscopic type of insect that you cannot see with the naked eye.  High levels of dust mites have been detected from mattresses, pillows, carpets, upholstered furniture, bed covers, clothes, soft toys and any woven material.  The principal allergen of the dust mite is found in its feces.  A gram of dust may contain 1,000 mites and 250,000 fecal particles.  Mite antigen is easily measured in the air during house cleaning activities.  Dust mites do not bite and do not cause harm to humans, other than by triggering allergies/asthma.    Ways to decrease your exposure to dust mites in your home:  Encase mattresses, box springs and pillows with a mite-impermeable barrier or cover   Wash sheets, blankets and drapes weekly in hot water (130 F) with detergent and dry them in a dryer on the hot setting.  Have the room cleaned frequently with a vacuum cleaner and a damp dust-mop.  For carpeting or rugs, vacuuming with a vacuum cleaner equipped with a high-efficiency particulate air (HEPA) filter.  The dust mite allergic individual should not be in a room which is being cleaned and should wait 1 hour after cleaning before going  into the room. Do not sleep on upholstered furniture (eg, couches).    If possible removing carpeting, upholstered furniture and drapery from the home is ideal.  Horizontal blinds should be eliminated in the rooms where the person spends the most time (bedroom, study, television room).  Washable vinyl, roller-type shades are optimal. Remove all non-washable stuffed toys from the bedroom.  Wash stuffed toys weekly like sheets and blankets above.   Reduce indoor humidity to less than 50%.  Inexpensive humidity monitors can be purchased at most hardware stores.  Do not use a humidifier as can make the problem worse and are not recommended.  Control of Cockroach Allergen  Cockroach allergen has been identified as an important cause of acute attacks of asthma, especially in urban settings.  There are fifty-five species of cockroach that exist in the [place], however only three, the [place], [place] species produce allergen that can affect patients with Asthma.  Allergens can be obtained from fecal particles, egg casings and secretions from cockroaches.    Remove food sources. Reduce access to water. Seal access and entry points. Spray runways with 0.5-1% Diazinon or Chlorpyrifos Blow boric acid power under stoves and refrigerator. Place bait stations (hydramethylnon) at feeding sites.  Allergy Shots  Allergies are the result of a chain reaction that starts in the immune system. Your immune system controls how your body defends itself. For instance, if you have an allergy to pollen, your immune system identifies pollen as an invader or allergen. Your immune system overreacts by producing antibodies called Immunoglobulin E (IgE). These antibodies travel to cells that release chemicals, causing an allergic reaction.  The concept behind allergy immunotherapy, whether it is received in the form of shots or tablets, is that the immune system can be desensitized to specific allergens that trigger allergy symptoms. Although it requires time and patience, the  payback can be long-term relief. Allergy injections contain a dilute solution of those substances that you are allergic to based upon your skin testing and allergy history.   How Do Allergy Shots Work?  Allergy shots work much like a vaccine. Your body responds to injected amounts of a particular allergen given in increasing doses, eventually developing a resistance and tolerance to it. Allergy shots can lead to decreased, minimal or no allergy symptoms.  There generally are two phases: build-up and maintenance. Build-up often ranges from three to six months and involves receiving injections with increasing amounts of the allergens. The shots are typically given once or twice a week, though more rapid build-up schedules are sometimes used.  The maintenance phase begins when the most effective dose is reached. This dose is different for each person, depending on how allergic you are and your response to the build-up injections. Once the maintenance dose is reached, there are longer periods between injections, typically two to four weeks.  Occasionally doctors give cortisone-type shots that can temporarily reduce allergy symptoms. These types of shots are different and should not be confused with allergy immunotherapy shots.  Who Can Be Treated with Allergy Shots?  Allergy shots may be a good treatment approach for people with allergic rhinitis (hay fever), allergic asthma, conjunctivitis (eye allergy) or stinging insect allergy.   Before deciding to begin allergy shots, you should consider:   The length of allergy season and the severity of your symptoms  Whether medications and/or changes to your environment can control your symptoms  Your desire to avoid long-term medication use  Time: allergy immunotherapy requires  a major time commitment  Cost: may vary depending on your insurance coverage  Allergy shots for children age 50 and older are effective and often well tolerated. They might  prevent the onset of new allergen sensitivities or the progression to asthma.  Allergy shots are not started on patients who are pregnant but can be continued on patients who become pregnant while receiving them. In some patients with other medical conditions or who take certain common medications, allergy shots may be of risk. It is important to mention other medications you talk to your allergist.   What are the two types of build-ups offered:   RUSH or Rapid Desensitization -- one day of injections lasting from 8:30-4:30pm, injections every 1 hour.  Approximately half of the build-up process is completed in that one day.  The following week, normal build-up is resumed, and this entails ~16 visits either weekly or twice weekly, until reaching your "maintenance dose" which is continued weekly until eventually getting spaced out to every month for a duration of 3 to 5 years. The regular build-up appointments are nurse visits where the injections are administered, followed by required monitoring for 30 minutes.    Traditional build-up -- weekly visits for 6 -12 months until reaching "maintenance dose", then continue weekly until eventually spacing out to every 4 weeks as above. At these appointments, the injections are administered, followed by required monitoring for 30 minutes.     Either way is acceptable, and both are equally effective. With the rush protocol, the advantage is that less time is spent here for injections overall AND you would also reach maintenance dosing faster (which is when the clinical benefit starts to become more apparent). Not everyone is a candidate for rapid desensitization.   IF we proceed with the RUSH protocol, there are premedications which must be taken the day before and the day after the rush only (this includes antihistamines, steroids, and Singulair).  After the rush day, no prednisone or Singulair is required, and we just recommend antihistamines taken on your  injection day.  What Is An Estimate of the Costs?  If you are interested in starting allergy injections, please check with your insurance company about your coverage for both allergy vial sets and allergy injections.  Please do so prior to making the appointment to start injections.  The following are CPT codes to give to your insurance company. These are the amounts we BILL to the insurance company, but the amount YOU WILL PAY and WE RECEIVE IS SUBSTANTIALLY LESS and depends on the contracts we have with different insurance companies.   Amount Billed to Insurance One allergy vial set  CPT 95165   $ 1200     Two allergy vial set  CPT 95165   $ 2400     Three allergy vial set  CPT 95165   $ 3600     One injection   CPT 95115   $ 35  Two injections   CPT 95117   $ 40 RUSH (Rapid Desensitization) CPT 95180 x 8 hours $500/hour  Regarding the allergy injections, your co-pay may or may not apply with each injection, so please confirm this with your insurance company. When you start allergy injections, 1 or 2 sets of vials are made based on your allergies.  Not all patients can be on one set of vials. A set of vials lasts 6 months to a year depending on how quickly you can proceed with your build-up of your allergy injections.  Vials are personalized for each patient depending on their specific allergens.  How often are allergy injection given during the build-up period?   Injections are given at least weekly during the build-up period until your maintenance dose is achieved. Per the doctor's discretion, you may have the option of getting allergy injections two times per week during the build-up period. However, there must be at least 48 hours between injections. The build-up period is usually completed within 6-12 months depending on your ability to schedule injections and for adjustments for reactions. When maintenance dose is reached, your injection schedule is gradually changed to every two weeks and  later to every three weeks. Injections will then continue every 4 weeks. Usually, injections are continued for a total of 3-5 years.   When Will I Feel Better?  Some may experience decreased allergy symptoms during the build-up phase. For others, it may take as long as 12 months on the maintenance dose. If there is no improvement after a year of maintenance, your allergist will discuss other treatment options with you.  If you aren't responding to allergy shots, it may be because there is not enough dose of the allergen in your vaccine or there are missing allergens that were not identified during your allergy testing. Other reasons could be that there are high levels of the allergen in your environment or major exposure to non-allergic triggers like tobacco smoke.  What Is the Length of Treatment?  Once the maintenance dose is reached, allergy shots are generally continued for three to five years. The decision to stop should be discussed with your allergist at that time. Some people may experience a permanent reduction of allergy symptoms. Others may relapse and a longer course of allergy shots can be considered.  What Are the Possible Reactions?  The two types of adverse reactions that can occur with allergy shots are local and systemic. Common local reactions include very mild redness and swelling at the injection site, which can happen immediately or several hours after. Report a delayed reaction from your last injection. These include arm swelling or runny nose, watery eyes or cough that occurs within 12-24 hours after injection. A systemic reaction, which is less common, affects the entire body or a particular body system. They are usually mild and typically respond quickly to medications. Signs include increased allergy symptoms such as sneezing, a stuffy nose or hives.   Rarely, a serious systemic reaction called anaphylaxis can develop. Symptoms include swelling in the throat, wheezing, a  feeling of tightness in the chest, nausea or dizziness. Most serious systemic reactions develop within 30 minutes of allergy shots. This is why it is strongly recommended you wait in your doctor's office for 30 minutes after your injections. Your allergist is trained to watch for reactions, and his or her staff is trained and equipped with the proper medications to identify and treat them.   Report to the nurse immediately if you experience any of the following symptoms: swelling, itching or redness of the skin, hives, watery eyes/nose, breathing difficulty, excessive sneezing, coughing, stomach pain, diarrhea, or light headedness. These symptoms may occur within 15-20 minutes after injection and may require medication.   Who Should Administer Allergy Shots?  The preferred location for receiving shots is your prescribing allergist's office. Injections can sometimes be given at another facility where the physician and staff are trained to recognize and treat reactions, and have received instructions by your prescribing allergist.  What if I am late for an  injection?   Injection dose will be adjusted depending upon how many days or weeks you are late for your injection.   What if I am sick?   Please report any illness to the nurse before receiving injections. She may adjust your dose or postpone injections depending on your symptoms. If you have fever, flu, sinus infection or chest congestion it is best to postpone allergy injections until you are better. Never get an allergy injection if your asthma is causing you problems. If your symptoms persist, seek out medical care to get your health problem under control.  What If I am or Become Pregnant:  Women that become pregnant should schedule an appointment with [facility] before receiving any further allergy injections.

## 2022-11-14 NOTE — Progress Notes (Signed)
NEW PATIENT  Date of Service/Encounter:  11/14/22  Consult requested by: Oneal Grout, FNP   Assessment:   Mild intermittent asthma, uncomplicated   Seasonal and perennial allergic rhinitis (ragweed, weeds, indoor molds, outdoor molds, dust mites, and cockroach)  Lactose intolerance - with negative milk testing   Gastroesophageal reflux disease  Plan/Recommendations:   1. Seasonal and perennial allergic rhinitis - Testing today showed: ragweed, weeds, indoor molds, outdoor molds, dust mites, and cockroach - Copy of test results provided.  - Avoidance measures provided. - Stop taking: current medications - Start taking: Xyzal (levocetirizine) 5mg  tablet once daily and Ryaltris (olopatadine/mometasone) two sprays per nostril 1-2 times daily as needed - Samples of Ryaltris provided.  - You can use an extra dose of the antihistamine, if needed, for breakthrough symptoms.  - Consider nasal saline rinses 1-2 times daily to remove allergens from the nasal cavities as well as help with mucous clearance (this is especially helpful to do before the nasal sprays are given) - Consider allergy shots as a means of long-term control. - Allergy shots "re-train" and "reset" the immune system to ignore environmental allergens and decrease the resulting immune response to those allergens (sneezing, itchy watery eyes, runny nose, nasal congestion, etc).    - Allergy shots improve symptoms in 75-85% of patients.  - We can discuss more at the next appointment if the medications are not working for you. - Information on allergy shots provided.   2. Mild intermittent asthma, uncomplicated - Lung testing looked great. - I do not think that you have any asthma right now. - There is no need for an inhaler at this point in time.   3. Lactose intolerance - Testing was negative to milk and casein. - Symptoms are consistent with lactose intolerance.   4. Gastroesophageal reflux disease - Continue  with famotidine 40mg  daily as needed. - I do not think that you need any further treatment.   5. Return in about 2 months (around 01/14/2023). You can have the follow up appointment with Dr. Dellis Anes or a Nurse Practicioner (our Nurse Practitioners are excellent and always have Physician oversight!).    This note in its entirety was forwarded to the Provider who requested this consultation.  Subjective:   Corey Henry is a 27 y.o. male presenting today for evaluation of  Chief Complaint  Patient presents with   Frequent Infections    Gets frequent sinus infections    Food Intolerance    Dairy Products- severe abdominal pain, diarrhea. Some cheese bother him. He can't drink milk or eat ice cream.    Asthma    Some issues during the winter, has been previously given albuterol but was switched to a different one as it caused an increased heart rate.      Corey Henry has a history of the following: Patient Active Problem List   Diagnosis Date Noted   Gastroesophageal reflux disease 12/26/2019   Lower abdominal pain 12/26/2019   Irritable bowel syndrome with both constipation and diarrhea 12/26/2019   History of Clostridioides difficile colitis 12/26/2019   PFS (patellofemoral syndrome) 12/31/2011   CLOSED FRACTURE OF NECK OF METACARPAL BONE 07/19/2008    History obtained from: chart review and patient.  Willette Brace was referred by Oneal Grout, FNP.     Corey Henry is a 27 y.o. male presenting for an evaluation of chronic rhinitis and environmental allergies . He lived in [place] for his entire life.   Asthma/Respiratory  Symptom History: He was diagnosed  with asthma when he was in the 9th grade. He had chest pain and was diagnosed with a panic attack. He was SOB and he was tested for asthma with a spirometry. He was told that he had asthma. He could not take the albuterol because of intense heart racing. So he was switched to Xopenex from what it sounds like. He would  get a bit SOB, but the worse time of the year was in the winter when he is hunting. It never occurs inside. He was a smoker for two years or so.   Allergic Rhinitis Symptom History: He reports that that he gets recurrent sinus infections. He has had this for years. He usually gets antibiotics for this. His last antibiotic course was a couple of weeks ago, maybe  4-5 months ago. He was never tested in the past. He has been on Flonase as well as Xyzal and Zyrtec in the past. He has been on Allegra in the past as well. He was also put on montelukast at some point and he thinks that it helped. He ended up leaving. This was years ago.   Food Allergy Symptom History: He has never been tested for it, but he cannot tolerate milk or ice cream. When he is exposed to cheese, he is fine. But certain cheese, he has stomach pain and diarrhea. He does not do lactose free milk, but he does not like the taste.  He tolerates all of the other foods without a problem. Dairy is the worst.   GERD Symptom History: He takes famotidine 40mg  PRN. He uses it when he eats pizza or spaghetti or GERD inducing foods.   He has a history of stress induced palpitations, but he does not have a problem with this now. He does have anxiety and is on Klonopin BID.  Otherwise, there is no history of other atopic diseases, including drug allergies, stinging insect allergies, or contact dermatitis. There is no significant infectious history. Vaccinations are up to date.    Past Medical History: Patient Active Problem List   Diagnosis Date Noted   Gastroesophageal reflux disease 12/26/2019   Lower abdominal pain 12/26/2019   Irritable bowel syndrome with both constipation and diarrhea 12/26/2019   History of Clostridioides difficile colitis 12/26/2019   PFS (patellofemoral syndrome) 12/31/2011   CLOSED FRACTURE OF NECK OF METACARPAL BONE 07/19/2008    Medication List:  Allergies as of 11/14/2022       Reactions   Azithromycin Other  (See Comments)   Patient stated that he has sore throat. Mouth sores   Erythromycin    Unknown reaction   Morphine Hives        Medication List        Accurate as of November 14, 2022 12:45 PM. If you have any questions, ask your nurse or doctor.          STOP taking these medications    Macrobid 100 MG capsule Generic drug: nitrofurantoin (macrocrystal-monohydrate) Stopped by: Alfonse Spruce   triamcinolone cream 0.5 % Commonly known as: KENALOG Stopped by: Alfonse Spruce       TAKE these medications    clonazePAM 0.5 MG tablet Commonly known as: KLONOPIN Take 0.5 mg by mouth 2 (two) times daily.   EPINEPHrine 0.3 mg/0.3 mL Soaj injection Commonly known as: EPI-PEN Inject 0.3 mg into the muscle as needed for anaphylaxis.   famotidine 40 MG tablet Commonly known as: PEPCID Take 1 tablet (40  mg total) by mouth daily. What changed:  when to take this reasons to take this   FiberCon 625 MG tablet Generic drug: polycarbophil Take 1,250 mg by mouth daily.   ibuprofen 600 MG tablet Commonly known as: ADVIL Take 1 tablet by mouth 3 (three) times daily as needed.   levocetirizine 5 MG tablet Commonly known as: XYZAL Take 1 tablet (5 mg total) by mouth every evening. Started by: Alfonse Spruce   propranolol 10 MG tablet Commonly known as: INDERAL Take 1 tablet by mouth 3 (three) times daily as needed.   Ryaltris 403-47 MCG/ACT Susp Generic drug: Olopatadine-Mometasone Place 2 sprays into the nose in the morning and at bedtime. Started by: Alfonse Spruce   Vitamin D 50 MCG (2000 UT) tablet Take 2,000 Units by mouth daily.        Birth History: non-contributory  Developmental History: non-contributory  Past Surgical History: Past Surgical History:  Procedure Laterality Date   ADENOIDECTOMY     APPENDECTOMY     COLONOSCOPY W/ BIOPSIES     ESOPHAGOGASTRODUODENOSCOPY     LUMBAR LAMINECTOMY/DECOMPRESSION MICRODISCECTOMY  Right 11/26/2020   Procedure: MICRODISCECTOMY LUMBAR FIVE-SACRAL ONE;  Surgeon: Tressie Stalker, MD;  Location: [facility] OR;  Service: Neurosurgery;  Laterality: Right;   LUMBAR MICRODISCECTOMY     MEATOTOMY     TONSILLECTOMY AND ADENOIDECTOMY     TYMPANOSTOMY TUBE PLACEMENT       Family History: Family History  Problem Relation Age of Onset   Diabetes Father    Diverticulitis Paternal Uncle    Colon cancer Maternal Grandmother    Lung cancer Maternal Grandfather    Emphysema Maternal Grandfather    Mesothelioma Paternal Grandfather    COPD Paternal Grandfather    Cancer Other    Diabetes Other    Eczema Cousin    Stomach cancer Neg Hx    Pancreatic cancer Neg Hx    Esophageal cancer Neg Hx    Inflammatory bowel disease Neg Hx    Liver disease Neg Hx      Social History: Ashvin lives at home with his family. He is in the middle of a separation. They live in a house that was built in the 1980s or 1990s. There is hardwood throughout the home. There is gas heating and a heat pump for heating and cooling. There are dogs inside of the home. There are no dust mite coverings on the bedding. There is vape exposure in the home. 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 There is no HEPA filter in the home. They do not live near an interstate or industrial area.    Review of systems otherwise negative other than that mentioned in the HPI.    Objective:   Blood pressure 126/84, pulse 97, temperature 98 F (36.7 C), resp. rate 16, height 6\' 1"  (1.854 m), weight 226 lb 2 oz (102.6 kg), SpO2 96%. Body mass index is 29.83 kg/m.     Physical Exam Vitals reviewed.  Constitutional:      General: He is not in acute distress.    Appearance: He is well-developed and normal weight.  HENT:     Head: Normocephalic and atraumatic.     Right Ear: Tympanic membrane, ear canal and external ear normal. No drainage, swelling or  tenderness. Tympanic membrane is not injected, scarred, erythematous, retracted or bulging.     Left Ear: Tympanic membrane, ear  canal and external ear normal. No drainage, swelling or tenderness. Tympanic membrane is not injected, scarred, erythematous, retracted or bulging.     Nose: No nasal deformity, septal deviation, mucosal edema or rhinorrhea.     Right Turbinates: Enlarged, swollen and pale.     Left Turbinates: Enlarged, swollen and pale.     Right Sinus: No maxillary sinus tenderness or frontal sinus tenderness.     Left Sinus: No maxillary sinus tenderness or frontal sinus tenderness.     Mouth/Throat:     Lips: Pink.     Mouth: Mucous membranes are moist. Mucous membranes are not pale and not dry.     Pharynx: Uvula midline.  Eyes:     General: Lids are normal. Allergic shiner present.        Right eye: No discharge.        Left eye: No discharge.     Conjunctiva/sclera: Conjunctivae normal.     Right eye: Right conjunctiva is not injected. No chemosis.    Left eye: Left conjunctiva is not injected. No chemosis.    Pupils: Pupils are equal, round, and reactive to light.  Cardiovascular:     Rate and Rhythm: Normal rate and regular rhythm.     Heart sounds: Normal heart sounds.  Pulmonary:     Effort: Pulmonary effort is normal. No tachypnea, accessory muscle usage or respiratory distress.     Breath sounds: Normal breath sounds. No wheezing, rhonchi or rales.  Chest:     Chest wall: No tenderness.  Abdominal:     Tenderness: There is no abdominal tenderness. There is no guarding or rebound.  Musculoskeletal:     Cervical back: Full passive range of motion without pain.  Lymphadenopathy:     Head:     Right side of head: No submandibular, tonsillar or occipital adenopathy.     Left side of head: No submandibular, tonsillar or occipital adenopathy.     Cervical: No cervical adenopathy.  Skin:    Coloration: Skin is not pale.     Findings: No abrasion, erythema,  petechiae or rash. Rash is not papular, urticarial or vesicular.  Neurological:     Mental Status: He is alert.  Psychiatric:        Behavior: Behavior is cooperative.      Diagnostic studies:    Spirometry: results normal (FEV1: 4.26/85%, FVC: 4.93/81%, FEV1/FVC: 86%).    Spirometry consistent with normal pattern.   Allergy Studies:     Airborne Adult Perc - 11/14/22 1114     Time Antigen Placed 1114    Allergen Manufacturer Waynette Buttery    Location Back    Number of Test 55    1. Control-Buffer 50% Glycerol Negative    2. Control-Histamine 3+    3. Bahia Negative    4. French Southern Territories Negative    5. Johnson Negative    6. Kentucky Blue Negative    7. Meadow Fescue Negative    8. Perennial Rye Negative    9. Timothy Negative    10. Ragweed Mix Negative    11. Cocklebur Negative    12. Plantain,  English Negative    13. Baccharis Negative    14. Dog Fennel Negative    15. Russian Thistle Negative    16. Lamb's Quarters Negative    17. Sheep Sorrell Negative    18. Rough Pigweed Negative    19. Marsh Elder, Rough Negative    20. Mugwort, Common Negative    21. Box, Elder  Negative    22. Cedar, red Negative    23. Sweet Gum Negative    24. Pecan Pollen Negative    25. Pine Mix Negative    26. Walnut, Black Pollen Negative    27. Red Mulberry Negative    28. Ash Mix Negative    29. Birch Mix Negative    30. Beech American Negative    31. Cottonwood, Guinea-Bissau Negative    32. Hickory, White Negative    33. Maple Mix Negative    34. Oak, Guinea-[place]issau Mix Negative    35. Sycamore Eastern Negative    36. Alternaria Alternata Negative    37. Cladosporium Herbarum Negative    38. Aspergillus Mix Negative    39. Penicillium Mix Negative    40. Bipolaris Sorokiniana (Helminthosporium) Negative    41. Drechslera Spicifera (Curvularia) Negative    42. Mucor Plumbeus Negative    43. Fusarium Moniliforme Negative    44. Aureobasidium Pullulans (pullulara) Negative    45. Rhizopus Oryzae  Negative    46. Botrytis Cinera Negative    47. Epicoccum Nigrum Negative    48. Phoma Betae Negative    49. Dust Mite Mix Negative    50. Cat Hair 10,000 BAU/ml Negative    51.  Dog Epithelia Negative    52. Mixed Feathers Negative    53. Horse Epithelia Negative    54. Cockroach, German Negative    55. Tobacco Leaf Negative    Comments Highly dermatographic             Intradermal - 11/14/22 1137     Time Antigen Placed 1145    Allergen Manufacturer Greer    Location Arm    Number of Test 16    Intradermal Select    Control Negative    Bahia Negative    French Southern Territories Negative    Johnson Negative    7 Grass Negative    Ragweed Mix 2+    Weed Mix 2+    Tree Mix Negative    Mold 1 3+    Mold 2 4+    Mold 3 3+    Mold 4 4+    Mite Mix 2+    Cat Negative    Dog Negative    Cockroach 3+    Other Omitted             Food Adult Perc - 11/14/22 1100     Time Antigen Placed 1114    Allergen Manufacturer Greer    Location Back    Number of allergen test 2    5. Milk, Cow Negative    6. Casein Negative             Allergy testing results were read and interpreted by myself, documented by clinical staff.         Malachi Bonds, MD [facility]

## 2022-11-18 ENCOUNTER — Other Ambulatory Visit (HOSPITAL_COMMUNITY): Payer: Self-pay

## 2022-12-12 ENCOUNTER — Ambulatory Visit: Payer: Self-pay | Admitting: Allergy & Immunology

## 2023-01-14 ENCOUNTER — Ambulatory Visit: Payer: Self-pay | Admitting: Allergy & Immunology

## 2023-01-14 DIAGNOSIS — J309 Allergic rhinitis, unspecified: Secondary | ICD-10-CM

## 2023-06-15 ENCOUNTER — Ambulatory Visit
Admission: RE | Admit: 2023-06-15 | Discharge: 2023-06-15 | Disposition: A | Discharge status: Home / Self Care | Source: Ambulatory Visit | Attending: Family Medicine | Admitting: Family Medicine

## 2023-06-15 VITALS — BP 130/86 | HR 85 | Temp 98.6°F | Resp 18

## 2023-06-15 DIAGNOSIS — R112 Nausea with vomiting, unspecified: Secondary | ICD-10-CM | POA: Diagnosis not present

## 2023-06-15 DIAGNOSIS — M545 Low back pain, unspecified: Secondary | ICD-10-CM | POA: Diagnosis not present

## 2023-06-15 LAB — POCT URINALYSIS DIP (MANUAL ENTRY)
Bilirubin, UA: NEGATIVE
Blood, UA: NEGATIVE
Glucose, UA: NEGATIVE mg/dL
Ketones, POC UA: NEGATIVE mg/dL
Leukocytes, UA: NEGATIVE
Nitrite, UA: NEGATIVE
Spec Grav, UA: 1.025 (ref 1.010–1.025)
Urobilinogen, UA: 0.2 U/dL
pH, UA: 7 (ref 5.0–8.0)

## 2023-06-15 MED ORDER — ONDANSETRON 4 MG PO TBDP
4.0000 mg | ORAL_TABLET | Freq: Once | ORAL | Status: AC
  Administered 2023-06-15: 4 mg via ORAL

## 2023-06-15 MED ORDER — ONDANSETRON 4 MG PO TBDP: 4.0000 mg | ORAL_TABLET | Freq: Three times a day (TID) | ORAL | 0 refills | Status: AC | PRN

## 2023-06-15 NOTE — ED Triage Notes (Signed)
 Pt reports nausea and vomiting since morning, lower back pain and no energy.

## 2023-06-16 NOTE — ED Provider Notes (Signed)
 [facility]    CSN: [id number] Arrival date & time: 06/15/23  1743      History   Chief Complaint Chief Complaint  Patient presents with   Fatigue    Vomited multiple times this morning. No energy and lower back pain. - Entered by patient    HPI Corey Henry is a 28 y.o. male.   Patient presenting today with 1 day history of nausea, vomiting and several days of low back pain.  Denies fever, chills, abdominal pain, bowel changes, rashes, new foods or medications, recent sick contacts.  So far not trying anything over-the-counter for symptoms.  States he is concerned about a UTI.    Past Medical History:  Diagnosis Date   Anxiety    Diverticulosis    Exercise-induced asthma    as a child   Family history of adverse reaction to anesthesia    Doneta Public father- "allergic to an anesthesia medication, he was  hard to awaken" "Had to give him medication to reverse medication."   GERD (gastroesophageal reflux disease)    Heart murmur    Hemorrhoids    History of hiatal hernia    Hypertension    no longer takes medication   MVP (mitral valve prolapse)    Pre-diabetes    Sleep apnea    Vitamin D deficiency     Patient Active Problem List   Diagnosis Date Noted   Gastroesophageal reflux disease 12/26/2019   Lower abdominal pain 12/26/2019   Irritable bowel syndrome with both constipation and diarrhea 12/26/2019   History of Clostridioides difficile colitis 12/26/2019   PFS (patellofemoral syndrome) 12/31/2011   Closed fracture of neck of metacarpal bone 07/19/2008    Past Surgical History:  Procedure Laterality Date   ADENOIDECTOMY     APPENDECTOMY     COLONOSCOPY W/ BIOPSIES     ESOPHAGOGASTRODUODENOSCOPY     LUMBAR LAMINECTOMY/DECOMPRESSION MICRODISCECTOMY Right 11/26/2020   Procedure: MICRODISCECTOMY LUMBAR FIVE-SACRAL ONE;  Surgeon: Tressie Stalker, MD;  Location: [facility] OR;  Service: Neurosurgery;  Laterality: Right;   LUMBAR MICRODISCECTOMY      MEATOTOMY     TONSILLECTOMY AND ADENOIDECTOMY     TYMPANOSTOMY TUBE PLACEMENT         Home Medications    Prior to Admission medications   Medication Sig Start Date End Date Taking? Authorizing Provider  ondansetron (ZOFRAN-ODT) 4 MG disintegrating tablet Take 1 tablet (4 mg total) by mouth every 8 (eight) hours as needed for nausea or vomiting. 06/15/23  Yes Particia Nearing, PA-C  Cholecalciferol (VITAMIN D) 50 MCG (2000 UT) tablet Take 2,000 Units by mouth daily.    [provider]  clonazePAM (KLONOPIN) 0.5 MG tablet Take 0.5 mg by mouth 2 (two) times daily. 09/08/17 11/14/22  [provider]  EPINEPHrine 0.3 mg/0.3 mL IJ SOAJ injection Inject 0.3 mg into the muscle as needed for anaphylaxis.    [provider]  famotidine (PEPCID) 40 MG tablet Take 1 tablet (40 mg total) by mouth daily. Patient taking differently: Take 40 mg by mouth daily as needed for heartburn. 01/13/20 11/14/22  Mansouraty, Netty Starring., MD  ibuprofen (ADVIL) 600 MG tablet Take 1 tablet by mouth 3 (three) times daily as needed. 08/02/21   [provider]  levocetirizine (XYZAL) 5 MG tablet Take 1 tablet (5 mg total) by mouth every evening. 11/14/22   Alfonse Spruce, MD  Olopatadine-Mometasone Cristal Generous) [phone] MCG/ACT SUSP Place 2 sprays into the nose in the morning and at bedtime.  11/14/22   Alfonse Spruce, MD  polycarbophil (FIBERCON) 625 MG tablet Take 1,250 mg by mouth daily.    [provider]  propranolol (INDERAL) 10 MG tablet Take 1 tablet by mouth 3 (three) times daily as needed.    [provider]    Family History Family History  Problem Relation Age of Onset   Diabetes Father    Diverticulitis Paternal Uncle    Colon cancer Maternal Grandmother    Lung cancer Maternal Grandfather    Emphysema Maternal Grandfather    Mesothelioma Paternal Grandfather    COPD Paternal Grandfather    Cancer Other    Diabetes Other    Eczema Cousin     Stomach cancer Neg Hx    Pancreatic cancer Neg Hx    Esophageal cancer Neg Hx    Inflammatory bowel disease Neg Hx    Liver disease Neg Hx     Social History Social History   Tobacco Use   Smoking status: Former    Current packs/day: 0.00    Types: Cigarettes    Quit date: 08/28/2016    Years since quitting: 6.8   Smokeless tobacco: Former    Types: Snuff    Quit date: 2020  Vaping Use   Vaping status: Former   Substances: Nicotine, Flavoring   Devices: Uses flavor nicotine patches  Substance Use Topics   Alcohol use: Not Currently   Drug use: No     Allergies   Azithromycin, Erythromycin, and Morphine   Review of Systems Review of Systems PER HPI  Physical Exam Triage Vital Signs ED Triage Vitals  Encounter Vitals Group     BP 06/15/23 1807 130/86     Systolic BP Percentile --      Diastolic BP Percentile --      Pulse Rate 06/15/23 1807 85     Resp 06/15/23 1807 18     Temp 06/15/23 1807 98.6 F (37 C)     Temp Source 06/15/23 1807 Oral     SpO2 06/15/23 1807 97 %     Weight --      Height --      Head Circumference --      Peak Flow --      Pain Score 06/15/23 1810 7     Pain Loc --      Pain Education --      Exclude from Growth Chart --    No data found.  Updated Vital Signs BP 130/86 (BP Location: Right Arm)   Pulse 85   Temp 98.6 F (37 C) (Oral)   Resp 18   SpO2 97%   Visual Acuity Right Eye Distance:   Left Eye Distance:   Bilateral Distance:    Right Eye Near:   Left Eye Near:    Bilateral Near:     Physical Exam Vitals and nursing note reviewed.  Constitutional:      Appearance: Normal appearance.  HENT:     Head: Atraumatic.     Mouth/Throat:     Mouth: Mucous membranes are moist.  Eyes:     Extraocular Movements: Extraocular movements intact.     Conjunctiva/sclera: Conjunctivae normal.  Cardiovascular:     Rate and Rhythm: Normal rate and regular rhythm.  Pulmonary:     Effort: Pulmonary effort is normal.      Breath sounds: Normal breath sounds.  Abdominal:     General: Bowel sounds are normal. There is no distension.  Palpations: Abdomen is soft.     Tenderness: There is no abdominal tenderness. There is no right CVA tenderness, left CVA tenderness or guarding.  Musculoskeletal:        General: Normal range of motion.     Cervical back: Normal range of motion and neck supple.  Skin:    General: Skin is warm and dry.  Neurological:     General: No focal deficit present.     Mental Status: He is oriented to person, place, and time.  Psychiatric:        Mood and Affect: Mood normal.        Thought Content: Thought content normal.        Judgment: Judgment normal.      UC Treatments / Results  Labs (all labs ordered are listed, but only abnormal results are displayed) Labs Reviewed  POCT URINALYSIS DIP (MANUAL ENTRY) - Abnormal; Notable for the following components:      Result Value   Protein Ur, POC trace (*)    All other components within normal limits    EKG   Radiology No results found.  Procedures Procedures (including critical care time)  Medications Ordered in UC Medications  ondansetron (ZOFRAN-ODT) disintegrating tablet 4 mg (4 mg Oral Given 06/15/23 1833)    Initial Impression / Assessment and Plan / UC Course  I have reviewed the triage vital signs and the nursing notes.  Pertinent labs & imaging results that were available during my care of the patient were reviewed by me and considered in my medical decision making (see chart for details).     Urinalysis without evidence of urinary tract infection, vitals and exam reassuring today with no red flag findings.  Suspect viral GI illness and muscular back pain.  Treat back pain with over-the-counter pain relievers, heat, massage, stretches and Zofran, brat diet for nausea and vomiting symptoms.  Return precautions reviewed.  Final Clinical Impressions(s) / UC Diagnoses   Final diagnoses:  Acute bilateral  low back pain without sciatica  Nausea and vomiting, unspecified vomiting type   Discharge Instructions   None    ED Prescriptions     Medication Sig Dispense Auth. Provider   ondansetron (ZOFRAN-ODT) 4 MG disintegrating tablet Take 1 tablet (4 mg total) by mouth every 8 (eight) hours as needed for nausea or vomiting. 20 tablet Particia Nearing, [place]      PDMP not reviewed this encounter.   Particia Nearing, [place] 06/16/23 [phone]

## 2024-06-02 IMAGING — MR MR LUMBAR SPINE W/O CM
4 of 5 series · 26 of 48 positions shown · non-contrast
Comparison: Lumbar MRI 11/02/2020. CT Abdomen and Pelvis
10/25/2020.

CLINICAL DATA: 25-year-old male with persistent low back pain
status post micro discectomy x2. Occupational heavy lifting.

EXAM:
MRI LUMBAR SPINE WITHOUT CONTRAST
TECHNIQUE: Multiplanar, multisequence MR imaging of the lumbar spine was
performed. No intravenous contrast was administered.

[Series 3: T2 · sagittal · 4.0mm · 0.88mm/px · 6 of 15 slices shown (1 of 2)]
[im 1/15]
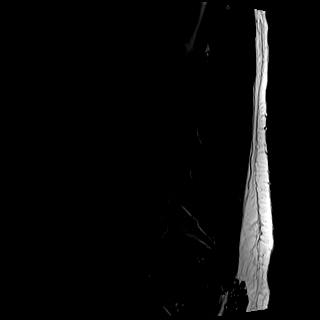
[im 3/15]
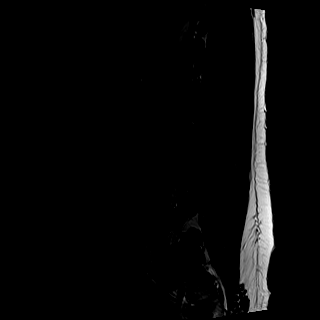
[im 6/15]
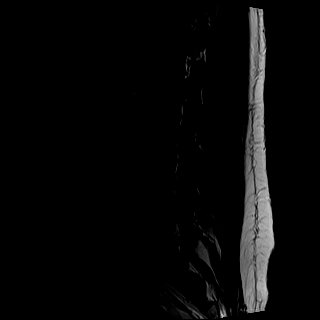
[im 9/15]
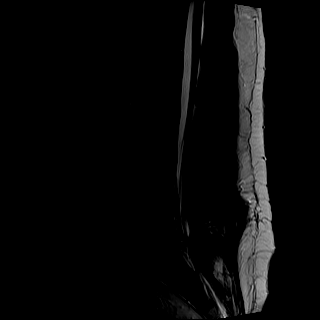
[im 12/15]
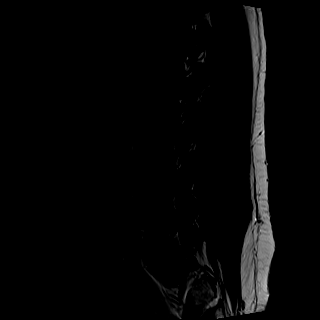
[im 15/15]
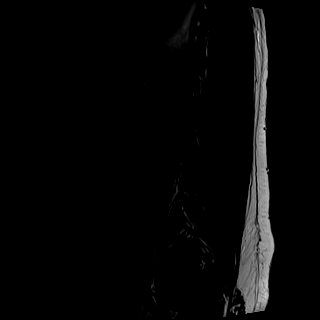

[Series 4: T1 · sagittal · 4.0mm · 0.41mm/px · 5 of 15 slices shown (1 of 2)]
[im 1/15]
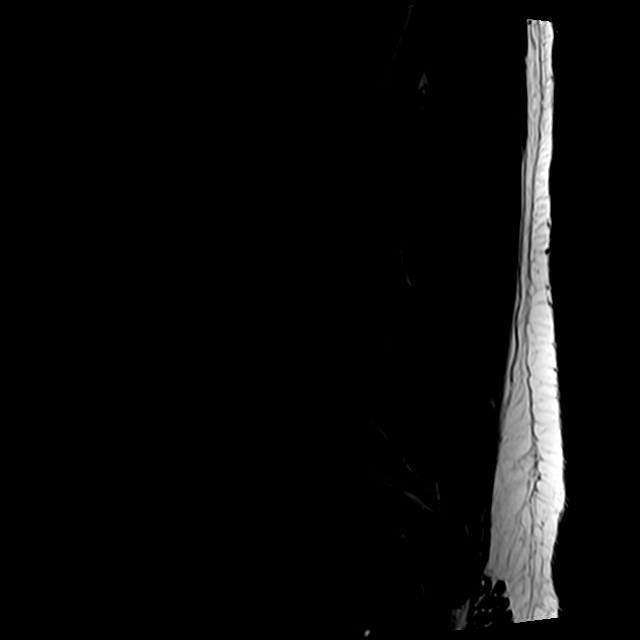
[im 4/15]
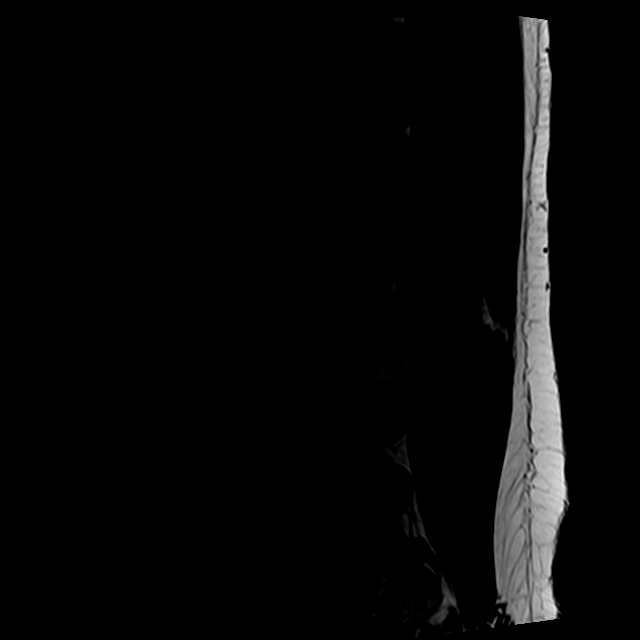
[im 8/15]
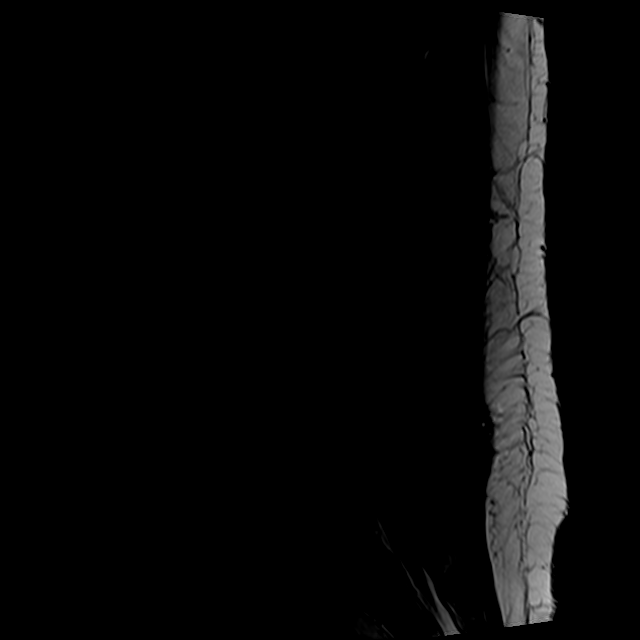
[im 11/15]
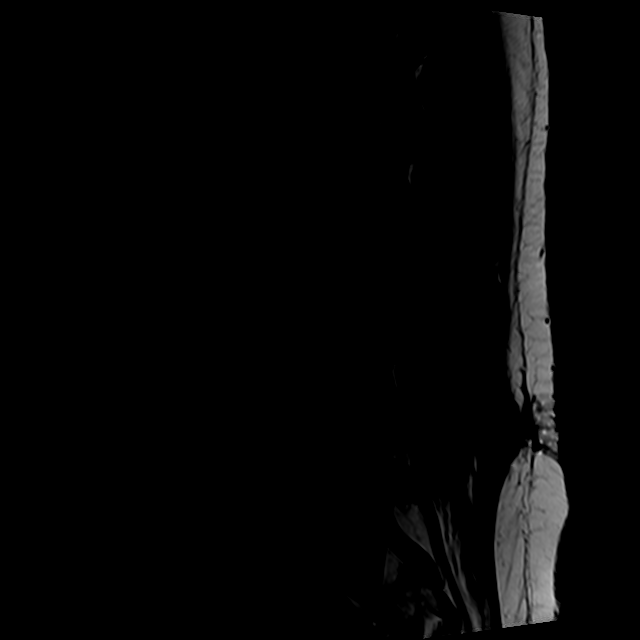
[im 15/15]
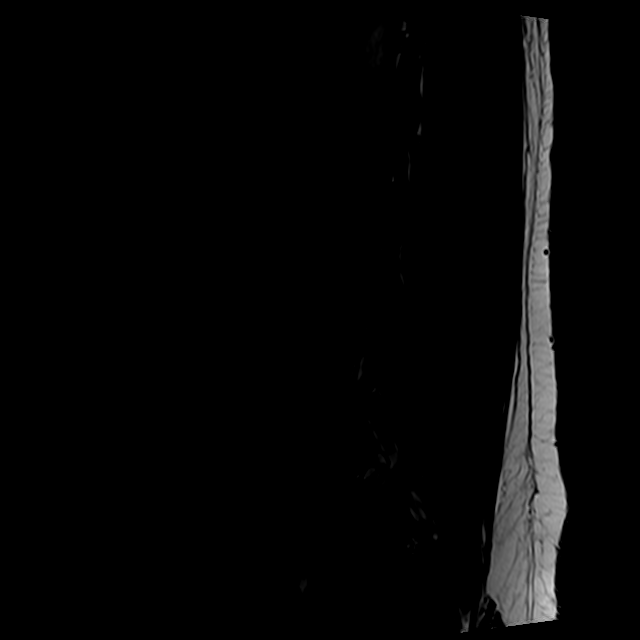

[Series 6: T2 · axial · 4.0mm · 0.78mm/px · z∈[-204,+27]mm · 10 of 43 slices shown (2 of 2)]
[im 3/43]
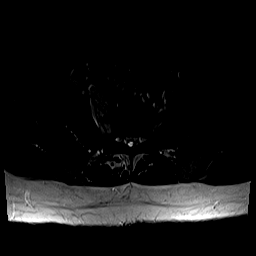
[im 6/43]
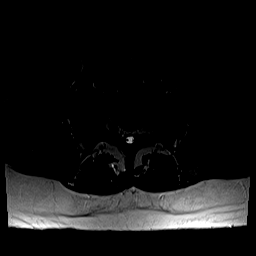
[im 9/43]
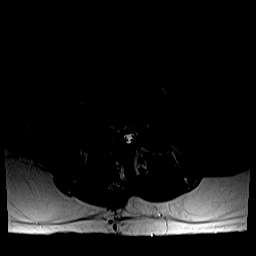
[im 15/43]
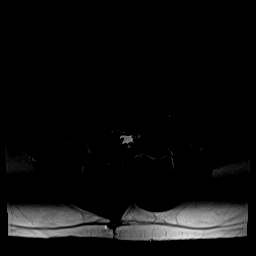
[im 20/43]
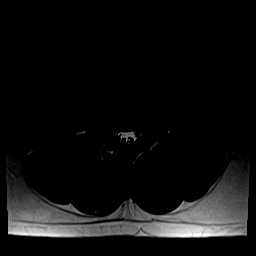
[im 23/43]
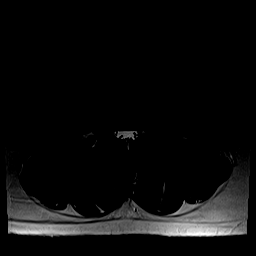
[im 26/43]
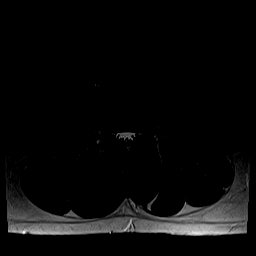
[im 31/43]
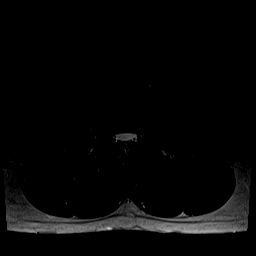
[im 37/43]
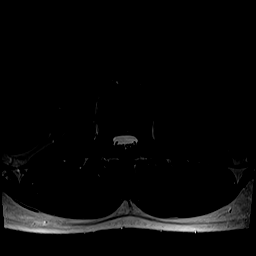
[im 43/43]
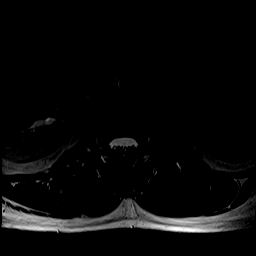

[Series 7: T1 · axial · 4.0mm · 0.39mm/px · z∈[-204,-3]mm · 5 of 43 slices shown (2 of 2)]
[im 3/43]
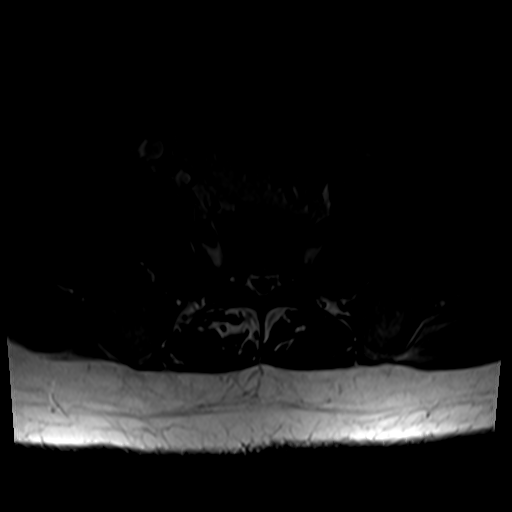
[im 6/43]
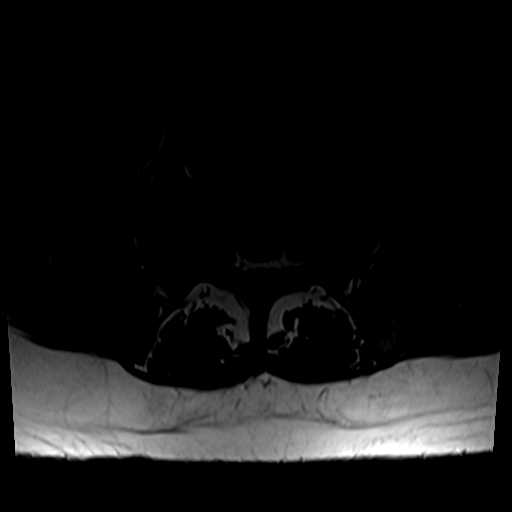
[im 9/43]
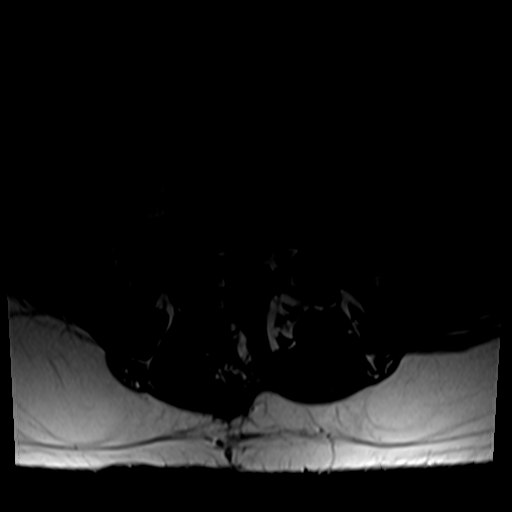
[im 23/43]
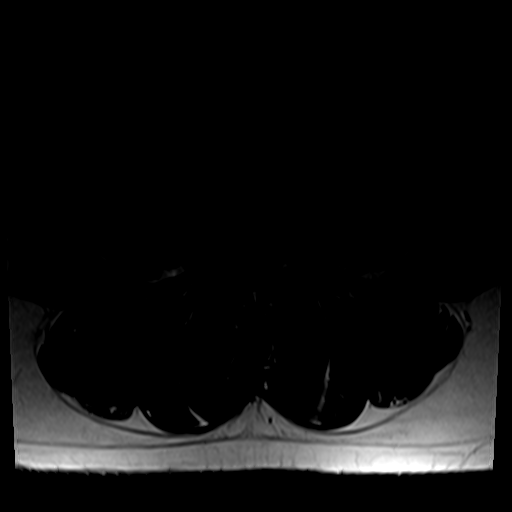
[im 37/43]
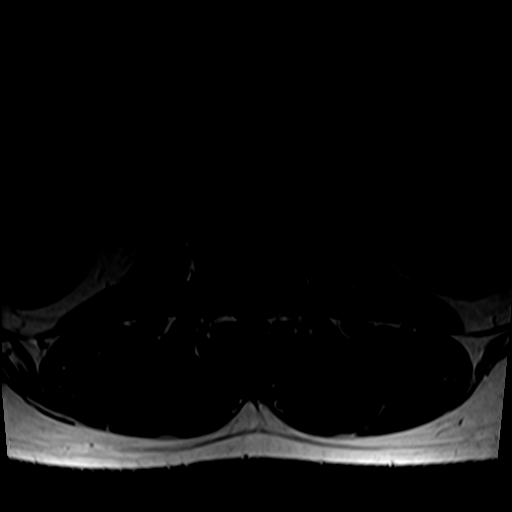

[26 of 48 positions shown; findings below may reference images not displayed]

FINDINGS: Segmentation: Normal on the comparison CT, which is the same
numbering system used last year.

Alignment: Stable chronic straightening of lordosis. No significant
spondylolisthesis.

Vertebrae: Normal background bone marrow signal. No marrow edema or
evidence of acute osseous abnormality. Intact visible sacrum.

Conus medullaris and cauda equina: Conus extends to the T12-L1
level. No lower spinal cord or conus signal abnormality. In general
of cauda equina nerve roots appear normal.

Paraspinal and other soft tissues: Mild postoperative changes to the
posterior paraspinal soft tissues at L4-L5. Otherwise negative.

Disc levels:

Normal intervertebral disc signal and morphology T12-L1 through
L3-L4.

Mild to moderate facet hypertrophy at the latter. No significant
stenosis.

L4-L5: Some congenital spinal canal narrowing due to short pedicle
distance. Chronic disc desiccation and circumferential disc bulge.
Increased chronic broad-based central disc protrusion with annular
fissure (series 6, image 27), although the abnormal disc is shallow.
Stable mild facet hypertrophy. Combined there is stable mild spinal
and lateral recess stenosis (descending L5 nerve levels). No
significant foraminal involvement or stenosis.

L5-S1: Some congenital spinal canal narrowing due to short pedicle
distance. Chronic disc desiccation and disc space loss.
Substantially improved right lateral recess patency here (right S1
nerve level series 6, image 33) with small residual broad-based
right lateral recess disc protrusion and annular fissure (same
image). Postoperative changes to the right lamina. Stable mild facet
hypertrophy. No convincing stenosis now.
IMPRESSION: 1. Postoperative virtually resolved right lateral recess and spinal
stenosis at L5-S1. Small residual disc protrusion and annular
fissure there. Some underlying congenital spinal canal narrowing and
chronic facet hypertrophy.

2. Mild progression of L4-L5 disc degeneration since last year in
the form of broad-based although shallow paracentral disc with
larger annular fissure. Some underlying congenital canal narrowing
and facet hypertrophy. Up to mild spinal and lateral recess
stenosis. Query L5 radiculitis.
